# Patient Record
Sex: Male | Born: 1940
Health system: Southern US, Community
[De-identification: ages and names within clinical notes are randomized; demographics above are authoritative.]

## PROBLEM LIST (undated history)

## (undated) DIAGNOSIS — I639 Cerebral infarction, unspecified: Secondary | ICD-10-CM

## (undated) DIAGNOSIS — E7841 Elevated Lipoprotein(a): Secondary | ICD-10-CM

## (undated) DIAGNOSIS — D6861 Antiphospholipid syndrome: Secondary | ICD-10-CM

## (undated) DIAGNOSIS — M40209 Unspecified kyphosis, site unspecified: Secondary | ICD-10-CM

## (undated) DIAGNOSIS — B019 Varicella without complication: Secondary | ICD-10-CM

## (undated) DIAGNOSIS — R29898 Other symptoms and signs involving the musculoskeletal system: Secondary | ICD-10-CM

## (undated) DIAGNOSIS — Z7901 Long term (current) use of anticoagulants: Secondary | ICD-10-CM

## (undated) DIAGNOSIS — G40209 Localization-related (focal) (partial) symptomatic epilepsy and epileptic syndromes with complex partial seizures, not intractable, without status epilepticus: Secondary | ICD-10-CM

## (undated) HISTORY — DX: Long term (current) use of anticoagulants: Z79.01

## (undated) HISTORY — DX: Unspecified kyphosis, site unspecified: M40.209

## (undated) HISTORY — DX: Elevated lipoprotein(a): E78.41

## (undated) HISTORY — DX: Cerebral infarction, unspecified: I63.9

## (undated) HISTORY — DX: Localization-related (focal) (partial) symptomatic epilepsy and epileptic syndromes with complex partial seizures, not intractable, without status epilepticus: G40.209

## (undated) HISTORY — DX: Antiphospholipid syndrome: D68.61

## (undated) HISTORY — DX: Varicella without complication: B01.9

---

## 1998-06-08 DIAGNOSIS — I639 Cerebral infarction, unspecified: Secondary | ICD-10-CM

## 1998-06-08 HISTORY — DX: Cerebral infarction, unspecified: I63.9

## 1998-08-12 ENCOUNTER — Ambulatory Visit (HOSPITAL_COMMUNITY): Admission: RE | Admit: 1998-08-12 | Discharge: 1998-08-12 | Payer: Self-pay | Admitting: Neurology

## 1998-08-12 ENCOUNTER — Encounter: Payer: Self-pay | Admitting: Neurology

## 1998-08-13 ENCOUNTER — Inpatient Hospital Stay (HOSPITAL_COMMUNITY): Admission: RE | Admit: 1998-08-13 | Discharge: 1998-08-17 | Payer: Self-pay | Admitting: Pediatrics

## 1998-10-17 ENCOUNTER — Ambulatory Visit (HOSPITAL_COMMUNITY): Admission: RE | Admit: 1998-10-17 | Discharge: 1998-10-17 | Payer: Self-pay | Admitting: Neurology

## 1998-10-21 ENCOUNTER — Encounter: Payer: Self-pay | Admitting: Neurology

## 1998-10-21 ENCOUNTER — Ambulatory Visit (HOSPITAL_COMMUNITY): Admission: RE | Admit: 1998-10-21 | Discharge: 1998-10-21 | Payer: Self-pay | Admitting: Neurology

## 1998-12-19 ENCOUNTER — Inpatient Hospital Stay (HOSPITAL_COMMUNITY): Admission: EM | Admit: 1998-12-19 | Discharge: 1998-12-26 | Payer: Self-pay | Admitting: Emergency Medicine

## 1998-12-19 ENCOUNTER — Encounter: Payer: Self-pay | Admitting: Neurology

## 1998-12-19 ENCOUNTER — Encounter: Payer: Self-pay | Admitting: Emergency Medicine

## 1998-12-23 ENCOUNTER — Encounter: Payer: Self-pay | Admitting: Neurology

## 1998-12-30 ENCOUNTER — Encounter: Admission: RE | Admit: 1998-12-30 | Discharge: 1999-03-30 | Payer: Self-pay | Admitting: Neurology

## 1999-03-12 ENCOUNTER — Encounter: Admission: RE | Admit: 1999-03-12 | Discharge: 1999-06-10 | Payer: Self-pay | Admitting: Neurology

## 1999-08-12 ENCOUNTER — Encounter: Admission: RE | Admit: 1999-08-12 | Discharge: 1999-09-26 | Payer: Self-pay | Admitting: *Deleted

## 1999-12-27 ENCOUNTER — Encounter: Payer: Self-pay | Admitting: Emergency Medicine

## 1999-12-27 ENCOUNTER — Inpatient Hospital Stay (HOSPITAL_COMMUNITY): Admission: EM | Admit: 1999-12-27 | Discharge: 2000-01-03 | Payer: Self-pay | Admitting: Internal Medicine

## 2004-04-17 ENCOUNTER — Ambulatory Visit: Payer: Self-pay | Admitting: Cardiology

## 2004-04-30 ENCOUNTER — Ambulatory Visit: Payer: Self-pay | Admitting: Cardiology

## 2004-05-28 ENCOUNTER — Ambulatory Visit: Payer: Self-pay | Admitting: *Deleted

## 2004-06-04 ENCOUNTER — Ambulatory Visit: Payer: Self-pay | Admitting: Internal Medicine

## 2004-06-30 ENCOUNTER — Ambulatory Visit: Payer: Self-pay | Admitting: Internal Medicine

## 2004-07-28 ENCOUNTER — Ambulatory Visit: Payer: Self-pay | Admitting: *Deleted

## 2004-08-04 ENCOUNTER — Ambulatory Visit: Payer: Self-pay | Admitting: Internal Medicine

## 2004-08-18 ENCOUNTER — Ambulatory Visit: Payer: Self-pay | Admitting: Cardiology

## 2004-09-15 ENCOUNTER — Ambulatory Visit: Payer: Self-pay | Admitting: Cardiology

## 2004-09-25 ENCOUNTER — Ambulatory Visit: Payer: Self-pay | Admitting: Cardiology

## 2004-10-15 ENCOUNTER — Ambulatory Visit: Payer: Self-pay | Admitting: Cardiology

## 2004-10-29 ENCOUNTER — Ambulatory Visit: Payer: Self-pay | Admitting: *Deleted

## 2004-11-25 ENCOUNTER — Ambulatory Visit: Payer: Self-pay | Admitting: Cardiology

## 2004-12-12 ENCOUNTER — Ambulatory Visit: Payer: Self-pay | Admitting: Cardiology

## 2005-01-09 ENCOUNTER — Ambulatory Visit: Payer: Self-pay | Admitting: Cardiology

## 2005-01-23 ENCOUNTER — Ambulatory Visit: Payer: Self-pay | Admitting: Internal Medicine

## 2005-01-30 ENCOUNTER — Ambulatory Visit: Payer: Self-pay | Admitting: Internal Medicine

## 2005-03-11 ENCOUNTER — Ambulatory Visit: Payer: Self-pay | Admitting: Cardiology

## 2005-03-27 ENCOUNTER — Ambulatory Visit: Payer: Self-pay | Admitting: Cardiovascular Disease

## 2005-04-10 ENCOUNTER — Ambulatory Visit: Payer: Self-pay | Admitting: Cardiology

## 2005-04-24 ENCOUNTER — Ambulatory Visit: Payer: Self-pay | Admitting: Cardiology

## 2005-05-08 ENCOUNTER — Ambulatory Visit: Payer: Self-pay | Admitting: Cardiovascular Disease

## 2005-05-22 ENCOUNTER — Ambulatory Visit: Payer: Self-pay | Admitting: Cardiology

## 2005-06-26 ENCOUNTER — Ambulatory Visit: Payer: Self-pay | Admitting: Cardiology

## 2005-07-03 ENCOUNTER — Ambulatory Visit: Payer: Self-pay | Admitting: Cardiology

## 2005-07-24 ENCOUNTER — Ambulatory Visit: Payer: Self-pay | Admitting: Cardiovascular Disease

## 2005-08-14 ENCOUNTER — Ambulatory Visit: Payer: Self-pay | Admitting: Internal Medicine

## 2005-08-28 ENCOUNTER — Ambulatory Visit: Payer: Self-pay | Admitting: Cardiovascular Disease

## 2005-09-10 ENCOUNTER — Ambulatory Visit: Payer: Self-pay | Admitting: Cardiology

## 2005-10-23 ENCOUNTER — Ambulatory Visit: Payer: Self-pay | Admitting: Cardiology

## 2005-11-13 ENCOUNTER — Ambulatory Visit: Payer: Self-pay | Admitting: Cardiology

## 2005-12-04 ENCOUNTER — Ambulatory Visit: Payer: Self-pay | Admitting: Cardiology

## 2005-12-15 ENCOUNTER — Ambulatory Visit: Payer: Self-pay | Admitting: *Deleted

## 2005-12-25 ENCOUNTER — Ambulatory Visit: Payer: Self-pay | Admitting: Cardiology

## 2005-12-28 ENCOUNTER — Ambulatory Visit: Payer: Self-pay | Admitting: Cardiology

## 2006-01-08 ENCOUNTER — Ambulatory Visit: Payer: Self-pay | Admitting: Internal Medicine

## 2006-01-22 ENCOUNTER — Ambulatory Visit: Payer: Self-pay | Admitting: Internal Medicine

## 2006-02-26 ENCOUNTER — Ambulatory Visit: Payer: Self-pay | Admitting: Cardiovascular Disease

## 2006-03-26 ENCOUNTER — Ambulatory Visit: Payer: Self-pay | Admitting: Cardiology

## 2006-04-02 ENCOUNTER — Ambulatory Visit: Payer: Self-pay | Admitting: Internal Medicine

## 2006-04-16 ENCOUNTER — Ambulatory Visit: Payer: Self-pay | Admitting: Cardiology

## 2006-04-30 ENCOUNTER — Ambulatory Visit: Payer: Self-pay | Admitting: Cardiology

## 2006-05-14 ENCOUNTER — Ambulatory Visit: Payer: Self-pay | Admitting: Cardiology

## 2006-05-27 ENCOUNTER — Ambulatory Visit: Payer: Self-pay | Admitting: Cardiology

## 2006-06-04 ENCOUNTER — Ambulatory Visit: Payer: Self-pay | Admitting: Cardiology

## 2006-06-08 HISTORY — PX: HERNIA REPAIR: SHX51

## 2006-06-11 ENCOUNTER — Ambulatory Visit: Payer: Self-pay | Admitting: Cardiology

## 2006-07-09 ENCOUNTER — Ambulatory Visit: Payer: Self-pay | Admitting: Internal Medicine

## 2006-07-30 ENCOUNTER — Ambulatory Visit: Payer: Self-pay | Admitting: Internal Medicine

## 2006-08-09 ENCOUNTER — Ambulatory Visit: Payer: Self-pay | Admitting: Cardiovascular Disease

## 2006-08-20 ENCOUNTER — Ambulatory Visit: Payer: Self-pay | Admitting: Cardiovascular Disease

## 2006-09-03 ENCOUNTER — Ambulatory Visit: Payer: Self-pay | Admitting: Cardiology

## 2006-09-17 ENCOUNTER — Ambulatory Visit: Payer: Self-pay | Admitting: Cardiovascular Disease

## 2006-10-01 ENCOUNTER — Ambulatory Visit: Payer: Self-pay | Admitting: Cardiology

## 2006-10-20 ENCOUNTER — Ambulatory Visit: Payer: Self-pay | Admitting: Internal Medicine

## 2006-10-29 ENCOUNTER — Ambulatory Visit: Payer: Self-pay | Admitting: Internal Medicine

## 2006-11-12 ENCOUNTER — Ambulatory Visit: Payer: Self-pay | Admitting: Cardiology

## 2006-11-26 ENCOUNTER — Ambulatory Visit: Payer: Self-pay | Admitting: Cardiovascular Disease

## 2006-11-26 LAB — CONVERTED CEMR LAB: INR: 4.4 — ABNORMAL HIGH (ref 0.9–2.0)

## 2006-12-03 ENCOUNTER — Ambulatory Visit: Payer: Self-pay | Admitting: Cardiology

## 2006-12-17 ENCOUNTER — Ambulatory Visit: Payer: Self-pay | Admitting: Cardiology

## 2007-01-07 ENCOUNTER — Ambulatory Visit: Payer: Self-pay | Admitting: Internal Medicine

## 2007-01-21 ENCOUNTER — Ambulatory Visit: Payer: Self-pay | Admitting: Cardiology

## 2007-02-18 ENCOUNTER — Ambulatory Visit: Payer: Self-pay | Admitting: Cardiology

## 2007-03-11 ENCOUNTER — Ambulatory Visit: Payer: Self-pay | Admitting: Internal Medicine

## 2007-04-14 ENCOUNTER — Ambulatory Visit: Payer: Self-pay | Admitting: Cardiology

## 2007-05-24 ENCOUNTER — Ambulatory Visit: Payer: Self-pay | Admitting: Internal Medicine

## 2007-06-06 ENCOUNTER — Ambulatory Visit: Payer: Self-pay | Admitting: Cardiology

## 2007-07-01 ENCOUNTER — Ambulatory Visit: Payer: Self-pay | Admitting: Internal Medicine

## 2007-07-15 ENCOUNTER — Ambulatory Visit: Payer: Self-pay | Admitting: Cardiology

## 2007-07-25 ENCOUNTER — Ambulatory Visit: Payer: Self-pay | Admitting: Cardiovascular Disease

## 2007-08-05 ENCOUNTER — Ambulatory Visit: Payer: Self-pay | Admitting: Internal Medicine

## 2007-09-09 ENCOUNTER — Ambulatory Visit: Payer: Self-pay | Admitting: Cardiology

## 2007-09-09 LAB — CONVERTED CEMR LAB: INR: 5.6 (ref 0.8–1.0)

## 2007-09-23 ENCOUNTER — Ambulatory Visit: Payer: Self-pay | Admitting: Internal Medicine

## 2007-10-07 ENCOUNTER — Ambulatory Visit: Payer: Self-pay | Admitting: Internal Medicine

## 2007-10-13 ENCOUNTER — Inpatient Hospital Stay (HOSPITAL_COMMUNITY): Admission: EM | Admit: 2007-10-13 | Discharge: 2007-10-14 | Payer: Self-pay | Admitting: Emergency Medicine

## 2007-10-17 ENCOUNTER — Ambulatory Visit: Payer: Self-pay | Admitting: Cardiology

## 2007-10-24 ENCOUNTER — Ambulatory Visit: Payer: Self-pay | Admitting: Cardiology

## 2007-11-07 ENCOUNTER — Ambulatory Visit: Payer: Self-pay | Admitting: Cardiovascular Disease

## 2007-11-21 ENCOUNTER — Ambulatory Visit: Payer: Self-pay | Admitting: Internal Medicine

## 2007-12-16 ENCOUNTER — Ambulatory Visit: Payer: Self-pay | Admitting: Cardiovascular Disease

## 2007-12-30 ENCOUNTER — Ambulatory Visit: Payer: Self-pay | Admitting: Cardiology

## 2008-01-20 ENCOUNTER — Ambulatory Visit: Payer: Self-pay | Admitting: Cardiovascular Disease

## 2008-02-10 ENCOUNTER — Ambulatory Visit: Payer: Self-pay | Admitting: Cardiovascular Disease

## 2008-03-16 ENCOUNTER — Ambulatory Visit: Payer: Self-pay | Admitting: Cardiology

## 2008-04-23 ENCOUNTER — Ambulatory Visit: Payer: Self-pay | Admitting: Cardiology

## 2008-05-18 ENCOUNTER — Ambulatory Visit: Payer: Self-pay | Admitting: Cardiovascular Disease

## 2008-06-07 ENCOUNTER — Ambulatory Visit: Payer: Self-pay | Admitting: Internal Medicine

## 2008-06-29 ENCOUNTER — Ambulatory Visit: Payer: Self-pay | Admitting: Internal Medicine

## 2008-07-20 ENCOUNTER — Ambulatory Visit: Payer: Self-pay | Admitting: Internal Medicine

## 2008-08-03 ENCOUNTER — Ambulatory Visit: Payer: Self-pay | Admitting: Internal Medicine

## 2008-08-24 ENCOUNTER — Ambulatory Visit: Payer: Self-pay | Admitting: Cardiology

## 2008-09-21 ENCOUNTER — Ambulatory Visit: Payer: Self-pay | Admitting: Internal Medicine

## 2008-10-02 ENCOUNTER — Ambulatory Visit: Payer: Self-pay | Admitting: Cardiology

## 2008-10-16 ENCOUNTER — Ambulatory Visit: Payer: Self-pay | Admitting: Cardiology

## 2008-11-13 ENCOUNTER — Encounter (INDEPENDENT_AMBULATORY_CARE_PROVIDER_SITE_OTHER): Payer: Self-pay | Admitting: *Deleted

## 2008-11-23 ENCOUNTER — Ambulatory Visit: Payer: Self-pay | Admitting: Internal Medicine

## 2008-11-23 ENCOUNTER — Encounter (INDEPENDENT_AMBULATORY_CARE_PROVIDER_SITE_OTHER): Payer: Self-pay | Admitting: Cardiology

## 2008-11-23 LAB — CONVERTED CEMR LAB: Protime: 19

## 2008-12-12 ENCOUNTER — Encounter: Payer: Self-pay | Admitting: *Deleted

## 2008-12-14 ENCOUNTER — Ambulatory Visit: Payer: Self-pay | Admitting: Cardiology

## 2008-12-14 LAB — CONVERTED CEMR LAB: POC INR: 2.9

## 2009-01-04 ENCOUNTER — Ambulatory Visit: Payer: Self-pay | Admitting: Cardiology

## 2009-01-18 ENCOUNTER — Ambulatory Visit: Payer: Self-pay | Admitting: Internal Medicine

## 2009-02-01 ENCOUNTER — Ambulatory Visit: Payer: Self-pay | Admitting: Cardiovascular Disease

## 2009-02-01 LAB — CONVERTED CEMR LAB: POC INR: 3.2

## 2009-02-22 ENCOUNTER — Ambulatory Visit: Payer: Self-pay | Admitting: Internal Medicine

## 2009-02-22 LAB — CONVERTED CEMR LAB: POC INR: 3.1

## 2009-02-25 ENCOUNTER — Encounter: Payer: Self-pay | Admitting: Cardiology

## 2009-02-25 DIAGNOSIS — G459 Transient cerebral ischemic attack, unspecified: Secondary | ICD-10-CM | POA: Insufficient documentation

## 2009-03-22 ENCOUNTER — Ambulatory Visit: Payer: Self-pay | Admitting: Internal Medicine

## 2009-03-22 LAB — CONVERTED CEMR LAB: POC INR: 2.8

## 2009-04-19 ENCOUNTER — Ambulatory Visit: Payer: Self-pay | Admitting: Cardiology

## 2009-04-19 LAB — CONVERTED CEMR LAB: POC INR: 2.6

## 2009-05-14 ENCOUNTER — Encounter: Payer: Self-pay | Admitting: Cardiovascular Disease

## 2009-05-17 ENCOUNTER — Encounter: Payer: Self-pay | Admitting: Cardiovascular Disease

## 2009-06-06 ENCOUNTER — Ambulatory Visit: Payer: Self-pay | Admitting: Internal Medicine

## 2009-06-21 ENCOUNTER — Ambulatory Visit: Payer: Self-pay | Admitting: Internal Medicine

## 2009-06-21 LAB — CONVERTED CEMR LAB: POC INR: 2.8

## 2009-07-12 ENCOUNTER — Ambulatory Visit: Payer: Self-pay | Admitting: Internal Medicine

## 2009-08-02 ENCOUNTER — Ambulatory Visit: Payer: Self-pay | Admitting: Cardiology

## 2009-08-30 ENCOUNTER — Ambulatory Visit: Payer: Self-pay | Admitting: Internal Medicine

## 2009-09-20 ENCOUNTER — Ambulatory Visit: Payer: Self-pay | Admitting: Internal Medicine

## 2009-09-27 ENCOUNTER — Encounter (INDEPENDENT_AMBULATORY_CARE_PROVIDER_SITE_OTHER): Payer: Self-pay | Admitting: Pharmacist

## 2009-10-18 ENCOUNTER — Ambulatory Visit: Payer: Self-pay | Admitting: Cardiology

## 2009-10-18 LAB — CONVERTED CEMR LAB: POC INR: 3.5

## 2009-11-15 ENCOUNTER — Ambulatory Visit: Payer: Self-pay | Admitting: Cardiovascular Disease

## 2009-11-15 LAB — CONVERTED CEMR LAB: POC INR: 4.5

## 2009-11-22 ENCOUNTER — Ambulatory Visit: Payer: Self-pay | Admitting: Cardiology

## 2009-11-22 LAB — CONVERTED CEMR LAB: POC INR: 3.7

## 2009-11-29 ENCOUNTER — Ambulatory Visit: Payer: Self-pay | Admitting: Internal Medicine

## 2009-11-29 LAB — CONVERTED CEMR LAB: POC INR: 3.2

## 2009-12-20 ENCOUNTER — Ambulatory Visit: Payer: Self-pay | Admitting: Cardiology

## 2009-12-20 LAB — CONVERTED CEMR LAB: POC INR: 2

## 2010-01-03 ENCOUNTER — Ambulatory Visit: Payer: Self-pay | Admitting: Cardiology

## 2010-01-31 ENCOUNTER — Ambulatory Visit: Payer: Self-pay | Admitting: Cardiology

## 2010-02-28 ENCOUNTER — Ambulatory Visit: Payer: Self-pay | Admitting: Internal Medicine

## 2010-03-28 ENCOUNTER — Encounter: Payer: Self-pay | Admitting: Cardiovascular Disease

## 2010-04-11 ENCOUNTER — Ambulatory Visit: Payer: Self-pay | Admitting: Cardiology

## 2010-04-11 LAB — CONVERTED CEMR LAB: POC INR: 3.3

## 2010-05-16 ENCOUNTER — Ambulatory Visit: Payer: Self-pay | Admitting: Internal Medicine

## 2010-06-13 ENCOUNTER — Ambulatory Visit: Admit: 2010-06-13 | Payer: Self-pay

## 2010-07-02 ENCOUNTER — Encounter (INDEPENDENT_AMBULATORY_CARE_PROVIDER_SITE_OTHER): Payer: Self-pay | Admitting: Pharmacist

## 2010-07-07 ENCOUNTER — Ambulatory Visit: Admission: RE | Admit: 2010-07-07 | Discharge: 2010-07-07 | Payer: Self-pay | Source: Home / Self Care

## 2010-07-08 NOTE — Medication Information (Signed)
Summary: rov/cb  Anticoagulant Therapy  Managed by: Bethena Midget, RN, BSN Referring MD: Anabel Bene MD, Maisie Fus PCP: Orlin Hilding MD: Juanda Chance MD, Darielle Hancher Indication 1: Stroke Prevention Indication 2: Transcient Ischemic Attack Lab Used: [facility] [place] Site: [address] INR POC 3.7 INR RANGE 2.5-3.5  Dietary changes: no    Health status changes: no    Bleeding/hemorrhagic complications: no    Recent/future hospitalizations: no    Any changes in medication regimen? no    Recent/future dental: no  Any missed doses?: no       Is patient compliant with meds? yes       Allergies: No Known Drug Allergies  Anticoagulation Management History:      The patient is taking warfarin and comes in today for a routine follow up visit.  Positive risk factors for bleeding include an age of 70 years or older and history of CVA/TIA.  The bleeding index is 'intermediate risk'.  Positive CHADS2 values include Prior Stroke/CVA/TIA.  Negative CHADS2 values include Age > 68 years old.  His last INR was 5.6 RATIO.  Anticoagulation responsible provider: Juanda Chance MD, Smitty Cords.  INR POC: 3.7.  Cuvette Lot#: [id number].  Exp: 01/2011.    Anticoagulation Management Assessment/Plan:      The patient's current anticoagulation dose is Warfarin sodium 5 mg tabs: Use as directed by Anticoagulation Clinic.  The target INR is 2.5 - 3.5.  The next INR is due 11/29/2009.  Anticoagulation instructions were given to patient.  Results were reviewed/authorized by Bethena Midget, RN, BSN.  He was notified by Bethena Midget, RN, BSN.         Prior Anticoagulation Instructions: INR 4.5. Hold Coumadin today, then take 1 tablet daily except 1.5 tablets on Mon, Wed, Fri. Recheck in 1 week.  Current Anticoagulation Instructions: INR 3.7 Today take 2.5mg s then resume 5mg s daily except 7.5mg s on Mondays, Wednesdays and Fridays. Recheck in one week

## 2010-07-08 NOTE — Medication Information (Signed)
Summary: rov/ewj  Anticoagulant Therapy  Managed by: Weston Brass, PharmD Referring MD: Anabel Bene MD, Maisie Fus PCP: Orlin Hilding MD: Riley Kill MD, Maisie Fus Indication 1: Stroke Prevention Indication 2: Transcient Ischemic Attack Lab Used: [facility] [place] Site: [address] INR POC 2.0 INR RANGE 2.5-3.5  Dietary changes: no    Health status changes: no    Bleeding/hemorrhagic complications: no    Recent/future hospitalizations: no    Any changes in medication regimen? no    Recent/future dental: no  Any missed doses?: no       Is patient compliant with meds? yes       Allergies: No Known Drug Allergies  Anticoagulation Management History:      The patient is taking warfarin and comes in today for a routine follow up visit.  Positive risk factors for bleeding include an age of 83 years or older and history of CVA/TIA.  The bleeding index is 'intermediate risk'.  Positive CHADS2 values include Prior Stroke/CVA/TIA.  Negative CHADS2 values include Age > 54 years old.  His last INR was 5.6 RATIO.  Anticoagulation responsible provider: Riley Kill MD, Maisie Fus.  INR POC: 2.0.  Cuvette Lot#: [id number].  Exp: 10/12.    Anticoagulation Management Assessment/Plan:      The patient's current anticoagulation dose is Warfarin sodium 5 mg tabs: Use as directed by Anticoagulation Clinic.  The target INR is 2.5 - 3.5.  The next INR is due 01/03/2010.  Anticoagulation instructions were given to patient.  Results were reviewed/authorized by Weston Brass, PharmD.  He was notified by Weston Brass, PharmD.         Prior Anticoagulation Instructions: INR 3.2  Continue on same dosage 5mg  daily except 7.5mg  on Mondays, Wednesdays, and Fridays. Recheck in 2 weeks.      Current Anticoagulation Instructions: INR 2.0  Take 2 tablets today then resume same dose of 1 tablet every day except 1 1/2 tablets on Monday, Wednesday and Friday.

## 2010-07-08 NOTE — Letter (Signed)
Summary: Anticoagulation Clinic Note   Anticoagulation Clinic Note   Imported By: Roderic Ovens 04/15/2010 13:43:26  _____________________________________________________________________  External Attachment:    Type:   Image     Comment:   External Document

## 2010-07-08 NOTE — Letter (Signed)
Summary: Anticoagulation Clinic Note  Anticoagulation Clinic Note   Imported By: Marylou Mccoy 04/08/2010 15:39:44  _____________________________________________________________________  External Attachment:    Type:   Image     Comment:   External Document

## 2010-07-08 NOTE — Medication Information (Signed)
Summary: rov/tm  Anticoagulant Therapy  Managed by: Cloyde Reams, RN, BSN Referring MD: Anabel Bene MD, Maisie Fus PCP: Orlin Hilding MD: Tenny Craw MD, Gunnar Fusi Indication 1: Stroke Prevention Indication 2: Transcient Ischemic Attack Lab Used: [facility] [place] Site: [address] INR POC 3.2 INR RANGE 2.5-3.5  Dietary changes: no    Health status changes: no    Bleeding/hemorrhagic complications: no    Recent/future hospitalizations: no    Any changes in medication regimen? no    Recent/future dental: no  Any missed doses?: no       Is patient compliant with meds? yes      Comments: Pt out of town unable to return until 12/20/09.  Allergies: No Known Drug Allergies  Anticoagulation Management History:      The patient is taking warfarin and comes in today for a routine follow up visit.  Positive risk factors for bleeding include an age of 40 years or older and history of CVA/TIA.  The bleeding index is 'intermediate risk'.  Positive CHADS2 values include Prior Stroke/CVA/TIA.  Negative CHADS2 values include Age > 70 years old.  His last INR was 5.6 RATIO.  Anticoagulation responsible provider: Tenny Craw MD, Gunnar Fusi.  INR POC: 3.2.  Cuvette Lot#: [id number].  Exp: 01/2011.    Anticoagulation Management Assessment/Plan:      The patient's current anticoagulation dose is Warfarin sodium 5 mg tabs: Use as directed by Anticoagulation Clinic.  The target INR is 2.5 - 3.5.  The next INR is due 12/20/2009.  Anticoagulation instructions were given to patient.  Results were reviewed/authorized by Cloyde Reams, RN, BSN.  He was notified by Cloyde Reams RN.         Prior Anticoagulation Instructions: INR 3.7 Today take 2.5mg s then resume 5mg s daily except 7.5mg s on Mondays, Wednesdays and Fridays. Recheck in one week  Current Anticoagulation Instructions: INR 3.2  Continue on same dosage 5mg  daily except 7.5mg  on Mondays, Wednesdays, and Fridays. Recheck in 2 weeks.

## 2010-07-08 NOTE — Medication Information (Signed)
Summary: rov/sp  Anticoagulant Therapy  Managed by: Cloyde Reams, RN, BSN Referring MD: Anabel Bene MD, Maisie Fus PCP: Orlin Hilding MD: Jens Som MD, Arlys John Indication 1: Stroke Prevention Indication 2: Transcient Ischemic Attack Lab Used: [facility] [place] Site: [address] INR POC 3.5 INR RANGE 2.5-3.5  Dietary changes: no    Health status changes: no    Bleeding/hemorrhagic complications: no    Recent/future hospitalizations: no    Any changes in medication regimen? no    Recent/future dental: no  Any missed doses?: no       Is patient compliant with meds? yes       Allergies: No Known Drug Allergies  Anticoagulation Management History:      The patient is taking warfarin and comes in today for a routine follow up visit.  Positive risk factors for bleeding include an age of 70 years or older and history of CVA/TIA.  The bleeding index is 'intermediate risk'.  Positive CHADS2 values include Prior Stroke/CVA/TIA.  Negative CHADS2 values include Age > 68 years old.  His last INR was 5.6 RATIO.  Anticoagulation responsible provider: Jens Som MD, Arlys John.  INR POC: 3.5.  Cuvette Lot#: [id number].  Exp: 03/2011.    Anticoagulation Management Assessment/Plan:      The patient's current anticoagulation dose is Warfarin sodium 5 mg tabs: Use as directed by Anticoagulation Clinic.  The target INR is 2.5 - 3.5.  The next INR is due 01/31/2010.  Anticoagulation instructions were given to patient.  Results were reviewed/authorized by Cloyde Reams, RN, BSN.  He was notified by Cloyde Reams RN.         Prior Anticoagulation Instructions: INR 2.0  Take 2 tablets today then resume same dose of 1 tablet every day except 1 1/2 tablets on Monday, Wednesday and Friday.    Current Anticoagulation Instructions: INR 3.5  Take 5mg  today, then resume same dosage 5mg  daily except 7.5mg  on Mondays, Wednesdays, and Fridays.  Recheck in 4 weeks.

## 2010-07-08 NOTE — Medication Information (Signed)
Summary: rov/tm  Anticoagulant Therapy  Managed by: Elaina Pattee, PharmD Referring MD: Anabel Bene MD, Maisie Fus PCP: Orlin Hilding MD: Graciela Husbands MD, Viviann Spare Indication 1: Stroke Prevention Lab Used: [facility] [place] Site: [address] INR RANGE 2.5-3.5  Dietary changes: no    Health status changes: no    Bleeding/hemorrhagic complications: no    Recent/future hospitalizations: no    Any changes in medication regimen? no    Recent/future dental: no  Any missed doses?: no       Is patient compliant with meds? yes       Allergies: No Known Drug Allergies  Anticoagulation Management History:      The patient is taking warfarin and comes in today for a routine follow up visit.  Positive risk factors for bleeding include an age of 70 years or older and history of CVA/TIA.  The bleeding index is 'intermediate risk'.  Positive CHADS2 values include Prior Stroke/CVA/TIA.  Negative CHADS2 values include Age > 84 years old.  His last INR was 5.6 RATIO.  Anticoagulation responsible provider: Graciela Husbands MD, Viviann Spare.  Cuvette Lot#: [id number].  Exp: 10/2010.    Anticoagulation Management Assessment/Plan:      The patient's current anticoagulation dose is Warfarin sodium 5 mg tabs: Use as directed by Anticoagulation Clinic.  The target INR is 2.5 - 3.5.  The next INR is due 10/18/2009.  Anticoagulation instructions were given to patient.  Results were reviewed/authorized by Elaina Pattee, PharmD.  He was notified by Elaina Pattee, PharmD.         Prior Anticoagulation Instructions: INR 2.4 Today take 10mg s then resume 5mg s daily except 7.5mg s on Mondays, Wednesdays and Fridays. Recheck in 3 weeks.   Current Anticoagulation Instructions: INR 3.4. Take 1 tablet daily except 1.5 tablets Mon, Wed, Fri.

## 2010-07-08 NOTE — Medication Information (Signed)
Summary: rov/jk  Anticoagulant Therapy  Managed by: Bethena Midget, RN, BSN Referring MD: Anabel Bene MD, Maisie Fus PCP: Orlin Hilding MD: Tenny Craw MD, Gunnar Fusi Indication 1: Stroke Prevention Indication 2: Transcient Ischemic Attack Lab Used: [facility] [place] Site: [address] INR POC 3.3 INR RANGE 2.5-3.5  Dietary changes: no    Health status changes: no    Bleeding/hemorrhagic complications: no    Recent/future hospitalizations: no    Any changes in medication regimen? yes       Details: Pt states he's been on digestive enyzme for approx 6 weeks.  Also taking 1/2 teaspoon daily of ginger with honey  Recent/future dental: no  Any missed doses?: no       Is patient compliant with meds? yes       Allergies: No Known Drug Allergies  Anticoagulation Management History:      The patient is taking warfarin and comes in today for a routine follow up visit.  Positive risk factors for bleeding include an age of 70 years or older and history of CVA/TIA.  The bleeding index is 'intermediate risk'.  Positive CHADS2 values include Prior Stroke/CVA/TIA.  Negative CHADS2 values include Age > 46 years old.  His last INR was 5.6 RATIO.  Anticoagulation responsible provider: Tenny Craw MD, Gunnar Fusi.  INR POC: 3.3.  Cuvette Lot#: [id number].  Exp: 03/2011.    Anticoagulation Management Assessment/Plan:      The patient's current anticoagulation dose is Warfarin sodium 5 mg tabs: Use as directed by Anticoagulation Clinic.  The target INR is 2.5 - 3.5.  The next INR is due 03/28/2010.  Anticoagulation instructions were given to patient.  Results were reviewed/authorized by Bethena Midget, RN, BSN.  He was notified by Bethena Midget, RN, BSN.         Prior Anticoagulation Instructions: INR 2.7  Continue taking 1 tablet (5mg ) every day except take 1.5 tablets (7.5mg ) on Mondays, Wednesdays, and Fridays.  Recheck in 4 weeks.   Current Anticoagulation Instructions: INR 3.3 Continue 5mg  daily  except 7.5mg  on Mondays, Wednesdays and Fridays. Recheck in 4 weeks.

## 2010-07-08 NOTE — Medication Information (Signed)
Summary: rov/cb  Anticoagulant Therapy  Managed by: Weston Brass, PharmD Referring MD: Anabel Bene MD, Maisie Fus PCP: Orlin Hilding MD: Antoine Poche MD, Fayrene Fearing Indication 1: Stroke Prevention Lab Used: [facility] [place] Site: [address] INR POC 3.5 INR RANGE 2.5-3.5  Dietary changes: no    Health status changes: no    Bleeding/hemorrhagic complications: no    Recent/future hospitalizations: no    Any changes in medication regimen? no    Recent/future dental: no  Any missed doses?: no       Is patient compliant with meds? yes       Allergies: No Known Drug Allergies  Anticoagulation Management History:      The patient is taking warfarin and comes in today for a routine follow up visit.  Positive risk factors for bleeding include an age of 44 years or older and history of CVA/TIA.  The bleeding index is 'intermediate risk'.  Positive CHADS2 values include Prior Stroke/CVA/TIA.  Negative CHADS2 values include Age > 40 years old.  His last INR was 5.6 RATIO.  Anticoagulation responsible provider: Antoine Poche MD, Fayrene Fearing.  INR POC: 3.5.  Cuvette Lot#: [id number].  Exp: 01/2011.    Anticoagulation Management Assessment/Plan:      The patient's current anticoagulation dose is Warfarin sodium 5 mg tabs: Use as directed by Anticoagulation Clinic.  The target INR is 2.5 - 3.5.  The next INR is due 11/15/2009.  Anticoagulation instructions were given to patient.  Results were reviewed/authorized by Weston Brass, PharmD.  He was notified by Weston Brass PharmD.         Prior Anticoagulation Instructions: INR 3.4. Take 1 tablet daily except 1.5 tablets Mon, Wed, Fri.  Current Anticoagulation Instructions: INR 3.5  Take 1 tablet today then resume same dose of 1 tablet every day except 1 1/2 tablet on Monday, Wednesday and Friday

## 2010-07-08 NOTE — Medication Information (Signed)
Summary: rov/ez  Anticoagulant Therapy  Managed by: Bethena Midget, RN, BSN Referring MD: Anabel Bene MD, Maisie Fus PCP: Orlin Hilding MD: Johney Frame MD, Fayrene Fearing Indication 1: Stroke Prevention Lab Used: [facility] [place] Site: [address] INR POC 3.4 INR RANGE 2.5-3.5  Dietary changes: no    Health status changes: no    Bleeding/hemorrhagic complications: no    Recent/future hospitalizations: no    Any changes in medication regimen? no    Recent/future dental: no  Any missed doses?: no       Is patient compliant with meds? yes       Allergies: No Known Drug Allergies  Anticoagulation Management History:      The patient is taking warfarin and comes in today for a routine follow up visit.  Positive risk factors for bleeding include an age of 70 years or older and history of CVA/TIA.  The bleeding index is 'intermediate risk'.  Positive CHADS2 values include Prior Stroke/CVA/TIA.  Negative CHADS2 values include Age > 68 years old.  His last INR was 5.6 RATIO.  Anticoagulation responsible provider: Etan Vasudevan MD, Fayrene Fearing.  INR POC: 3.4.  Cuvette Lot#: [id number].  Exp: 09/2010.    Anticoagulation Management Assessment/Plan:      The patient's current anticoagulation dose is Warfarin sodium 5 mg tabs: Use as directed by Anticoagulation Clinic.  The target INR is 2.5 - 3.5.  The next INR is due 08/02/2009.  Anticoagulation instructions were given to patient.  Results were reviewed/authorized by Bethena Midget, RN, BSN.  He was notified by Bethena Midget, RN, BSN.         Prior Anticoagulation Instructions: INR: 2.8 Continue the same dosage of 5mg  daily except 7.5mg  on Mondays, Wednesdays, Fridays Recheck in 3 weeks  Current Anticoagulation Instructions: INR 3.4 Today take 5mg s then resume 5mg s everyday except 7.5mg s on Mondays, Wednesdays and Fridays. Recheck in 3-4 weeks.

## 2010-07-08 NOTE — Medication Information (Signed)
Summary: rov/sp  Anticoagulant Therapy  Managed by: Weston Brass, PharmD Referring MD: Anabel Bene MD, Maisie Fus PCP: Orlin Hilding MD: Johney Frame MD, Fayrene Fearing Indication 1: Stroke Prevention Indication 2: Transcient Ischemic Attack Lab Used: [facility] [place] Site: [address] INR POC 3.1 INR RANGE 2.5-3.5  Dietary changes: no    Health status changes: no    Bleeding/hemorrhagic complications: no    Recent/future hospitalizations: no    Any changes in medication regimen? no    Recent/future dental: no  Any missed doses?: no       Is patient compliant with meds? yes       Allergies: No Known Drug Allergies  Anticoagulation Management History:      The patient is taking warfarin and comes in today for a routine follow up visit.  Positive risk factors for bleeding include an age of 70 years or older and history of CVA/TIA.  The bleeding index is 'intermediate risk'.  Positive CHADS2 values include Prior Stroke/CVA/TIA.  Negative CHADS2 values include Age > 68 years old.  His last INR was 5.6 RATIO.  Anticoagulation responsible provider: Rashauna Tep MD, Fayrene Fearing.  INR POC: 3.1.  Cuvette Lot#: [id number].  Exp: 07/2011.    Anticoagulation Management Assessment/Plan:      The patient's current anticoagulation dose is Warfarin sodium 5 mg tabs: Use as directed by Anticoagulation Clinic.  The target INR is 2.5 - 3.5.  The next INR is due 06/13/2010.  Anticoagulation instructions were given to patient.  Results were reviewed/authorized by Weston Brass, PharmD.  He was notified by Weston Brass PharmD.         Prior Anticoagulation Instructions: INR 3.3  Continue on same dosage 1 tablet daily except 1.5 tablets on Mondays, Wednesdays, and Fridays.  Recheck in 4 weeks.   Current Anticoagulation Instructions: INR 3.1  Continue same dose of 1 tablet every day except 1 1/2 tablets on Monday, Wednesday and Friday.  Recheck INR in 4 weeks.

## 2010-07-08 NOTE — Medication Information (Signed)
Summary: rov/eac  Anticoagulant Therapy  Managed by: Bethena Midget, RN, BSN Referring MD: Anabel Bene MD, Maisie Fus PCP: Orlin Hilding MD: Tenny Craw MD, Gunnar Fusi Indication 1: Stroke Prevention Lab Used: [facility] [place] Site: [address] INR POC 2.4 INR RANGE 2.5-3.5  Dietary changes: no    Health status changes: no    Bleeding/hemorrhagic complications: no    Recent/future hospitalizations: no    Any changes in medication regimen? no    Recent/future dental: no  Any missed doses?: no       Is patient compliant with meds? yes      Comments: 09/27/09 has appt at [facility] who he follows with   Allergies: No Known Drug Allergies  Anticoagulation Management History:      The patient is taking warfarin and comes in today for a routine follow up visit.  Positive risk factors for bleeding include an age of 47 years or older and history of CVA/TIA.  The bleeding index is 'intermediate risk'.  Positive CHADS2 values include Prior Stroke/CVA/TIA.  Negative CHADS2 values include Age > 55 years old.  His last INR was 5.6 RATIO.  Anticoagulation responsible provider: Tenny Craw MD, Gunnar Fusi.  INR POC: 2.4.  Cuvette Lot#: [id number].  Exp: 10/2010.    Anticoagulation Management Assessment/Plan:      The patient's current anticoagulation dose is Warfarin sodium 5 mg tabs: Use as directed by Anticoagulation Clinic.  The target INR is 2.5 - 3.5.  The next INR is due 09/20/2009.  Anticoagulation instructions were given to patient.  Results were reviewed/authorized by Bethena Midget, RN, BSN.  He was notified by Bethena Midget, RN, BSN.         Prior Anticoagulation Instructions: INR 2.6  Continue current dosing schedule.  Take 1.5 tablets on Monday, Wednesday, and Friday and take 1 tablet all other days.  Return to clinic in 4 weeks.    Current Anticoagulation Instructions: INR 2.4 Today take 10mg s then resume 5mg s daily except 7.5mg s on Mondays, Wednesdays and Fridays. Recheck in 3  weeks.

## 2010-07-08 NOTE — Medication Information (Signed)
Summary: rov/tm  Anticoagulant Therapy  Managed by: Weston Brass, PharmD Referring MD: Anabel Bene MD, Maisie Fus PCP: Orlin Hilding MD: Antoine Poche MD, Fayrene Fearing Indication 1: Stroke Prevention Indication 2: Transcient Ischemic Attack Lab Used: [facility] [place] Site: [address] INR RANGE 2.5-3.5  Dietary changes: no    Health status changes: no    Bleeding/hemorrhagic complications: no    Recent/future hospitalizations: no    Any changes in medication regimen? no    Recent/future dental: no  Any missed doses?: no       Is patient compliant with meds? yes       Allergies: No Known Drug Allergies  Anticoagulation Management History:      The patient is taking warfarin and comes in today for a routine follow up visit.  Positive risk factors for bleeding include an age of 70 years or older and history of CVA/TIA.  The bleeding index is 'intermediate risk'.  Positive CHADS2 values include Prior Stroke/CVA/TIA.  Negative CHADS2 values include Age > 68 years old.  His last INR was 5.6 RATIO.  Anticoagulation responsible provider: Antoine Poche MD, Fayrene Fearing.  Cuvette Lot#: [id number].  Exp: 03/2011.    Anticoagulation Management Assessment/Plan:      The patient's current anticoagulation dose is Warfarin sodium 5 mg tabs: Use as directed by Anticoagulation Clinic.  The target INR is 2.5 - 3.5.  The next INR is due 02/28/2010.  Anticoagulation instructions were given to patient.  Results were reviewed/authorized by Weston Brass, PharmD.  He was notified by Gweneth Fritter, PharmD Candidate.         Prior Anticoagulation Instructions: INR 3.5  Take 5mg  today, then resume same dosage 5mg  daily except 7.5mg  on Mondays, Wednesdays, and Fridays.  Recheck in 4 weeks.    Current Anticoagulation Instructions: INR 2.7  Continue taking 1 tablet (5mg ) every day except take 1.5 tablets (7.5mg ) on Mondays, Wednesdays, and Fridays.  Recheck in 4 weeks.

## 2010-07-08 NOTE — Medication Information (Signed)
Summary: ROV/LB  Anticoagulant Therapy  Managed by: Shelby Dubin, PharmD, BCPS, CPP Referring MD: Anabel Bene MD, Maisie Fus PCP: Orlin Hilding MD: Gala Romney MD, Reuel Boom Indication 1: Stroke Prevention Lab Used: [facility] [place] Site: [employer] INR POC 2.8 INR RANGE 2.5-3.5  Dietary changes: no    Health status changes: no    Bleeding/hemorrhagic complications: no    Recent/future hospitalizations: no    Any changes in medication regimen? no    Recent/future dental: no  Any missed doses?: no       Is patient compliant with meds? yes       Allergies: No Known Drug Allergies  Anticoagulation Management History:      The patient is taking warfarin and comes in today for a routine follow up visit.  Positive risk factors for bleeding include an age of 70 years or older and history of CVA/TIA.  The bleeding index is 'intermediate risk'.  Positive CHADS2 values include Prior Stroke/CVA/TIA.  Negative CHADS2 values include Age > 30 years old.  His last INR was 5.6 RATIO.  Anticoagulation responsible provider: Bensimhon MD, Reuel Boom.  INR POC: 2.8.  Cuvette Lot#: [id number].  Exp: 09/2010.    Anticoagulation Management Assessment/Plan:      The patient's current anticoagulation dose is Warfarin sodium 5 mg tabs: Use as directed by Anticoagulation Clinic.  The target INR is 2.5 - 3.5.  The next INR is due 07/12/2009.  Anticoagulation instructions were given to patient.  Results were reviewed/authorized by Shelby Dubin, PharmD, BCPS, CPP.  He was notified by Ysidro Evert, Pharm D Candidate.         Prior Anticoagulation Instructions: INR 2.2  TAKE 1.5 TABLETS (7.5MG ) THEN CONTINUE TAKING 1 TABLET EVERYDAY (5MG ) EXCEPT TAKE 1.5 TABLETS (7.5MG ) ON MONDAYS, WEDNESDAYS, FRIDAYS.      Current Anticoagulation Instructions: INR: 2.8 Continue the same dosage of 5mg  daily except 7.5mg  on Mondays, Wednesdays, Fridays Recheck in 3 weeks

## 2010-07-08 NOTE — Medication Information (Signed)
Summary: rov/tm  Anticoagulant Therapy  Managed by: Eda Keys, PharmD Referring MD: Anabel Bene MD, Maisie Fus PCP: Orlin Hilding MD: Jens Som MD, Arlys John Indication 1: Stroke Prevention Lab Used: [facility] [place] Site: [address] INR POC 2.6 INR RANGE 2.5-3.5           Allergies: No Known Drug Allergies  Anticoagulation Management History:      The patient is taking warfarin and comes in today for a routine follow up visit.  Positive risk factors for bleeding include an age of 68 years or older and history of CVA/TIA.  The bleeding index is 'intermediate risk'.  Positive CHADS2 values include Prior Stroke/CVA/TIA.  Negative CHADS2 values include Age > 72 years old.  His last INR was 5.6 RATIO.  Anticoagulation responsible provider: Jens Som MD, Arlys John.  INR POC: 2.6.  Cuvette Lot#: [id number].  Exp: 10/2010.    Anticoagulation Management Assessment/Plan:      The patient's current anticoagulation dose is Warfarin sodium 5 mg tabs: Use as directed by Anticoagulation Clinic.  The target INR is 2.5 - 3.5.  The next INR is due 08/30/2009.  Anticoagulation instructions were given to patient.  Results were reviewed/authorized by Eda Keys, PharmD.  He was notified by Eda Keys.         Prior Anticoagulation Instructions: INR 3.4 Today take 5mg s then resume 5mg s everyday except 7.5mg s on Mondays, Wednesdays and Fridays. Recheck in 3-4 weeks.   Current Anticoagulation Instructions: INR 2.6  Continue current dosing schedule.  Take 1.5 tablets on Monday, Wednesday, and Friday and take 1 tablet all other days.  Return to clinic in 4 weeks.

## 2010-07-08 NOTE — Medication Information (Signed)
Summary: rov/sp  Anticoagulant Therapy  Managed by: Elaina Pattee, PharmD Referring MD: Anabel Bene MD, Maisie Fus PCP: Orlin Hilding MD: Clifton James MD, Cristal Deer Indication 1: Stroke Prevention Lab Used: [facility] [place] Site: [address] INR POC 4.5 INR RANGE 2.5-3.5  Dietary changes: no    Health status changes: yes    Bleeding/hemorrhagic complications: no    Recent/future hospitalizations: no    Any changes in medication regimen? no    Recent/future dental: no  Any missed doses?: no       Is patient compliant with meds? yes       Allergies: No Known Drug Allergies  Anticoagulation Management History:      The patient is taking warfarin and comes in today for a routine follow up visit.  Positive risk factors for bleeding include an age of 70 years or older and history of CVA/TIA.  The bleeding index is 'intermediate risk'.  Positive CHADS2 values include Prior Stroke/CVA/TIA.  Negative CHADS2 values include Age > 68 years old.  His last INR was 5.6 RATIO.  Anticoagulation responsible provider: Clifton James MD, Cristal Deer.  INR POC: 4.5.  Cuvette Lot#: [id number].  Exp: 01/2011.    Anticoagulation Management Assessment/Plan:      The patient's current anticoagulation dose is Warfarin sodium 5 mg tabs: Use as directed by Anticoagulation Clinic.  The target INR is 2.5 - 3.5.  The next INR is due 11/22/2009.  Anticoagulation instructions were given to patient.  Results were reviewed/authorized by Elaina Pattee, PharmD.  He was notified by Elaina Pattee, PharmD.         Prior Anticoagulation Instructions: INR 3.5  Take 1 tablet today then resume same dose of 1 tablet every day except 1 1/2 tablet on Monday, Wednesday and Friday   Current Anticoagulation Instructions: INR 4.5. Hold Coumadin today, then take 1 tablet daily except 1.5 tablets on Mon, Wed, Fri. Recheck in 1 week.

## 2010-07-08 NOTE — Medication Information (Signed)
Summary: rov/tm  Anticoagulant Therapy  Managed by: Cloyde Reams, RN, BSN Referring MD: Anabel Bene MD, Maisie Fus PCP: Orlin Hilding MD: Juanda Chance MD, Bruce Indication 1: Stroke Prevention Indication 2: Transcient Ischemic Attack Lab Used: [facility] [place] Site: [address] INR POC 3.3 INR RANGE 2.5-3.5  Dietary changes: no    Health status changes: no    Bleeding/hemorrhagic complications: no    Recent/future hospitalizations: no    Any changes in medication regimen? no    Recent/future dental: no  Any missed doses?: no       Is patient compliant with meds? yes       Allergies: No Known Drug Allergies  Anticoagulation Management History:      The patient is taking warfarin and comes in today for a routine follow up visit.  Positive risk factors for bleeding include an age of 70 years or older and history of CVA/TIA.  The bleeding index is 'intermediate risk'.  Positive CHADS2 values include Prior Stroke/CVA/TIA.  Negative CHADS2 values include Age > 32 years old.  His last INR was 5.6 RATIO.  Anticoagulation responsible provider: Juanda Chance MD, Smitty Cords.  INR POC: 3.3.  Cuvette Lot#: [id number].  Exp: 04/2011.    Anticoagulation Management Assessment/Plan:      The patient's current anticoagulation dose is Warfarin sodium 5 mg tabs: Use as directed by Anticoagulation Clinic.  The target INR is 2.5 - 3.5.  The next INR is due 05/09/2010.  Anticoagulation instructions were given to patient.  Results were reviewed/authorized by Cloyde Reams, RN, BSN.  He was notified by Cloyde Reams, RN.         Prior Anticoagulation Instructions: INR 3.3 Continue 5mg  daily except 7.5mg  on Mondays, Wednesdays and Fridays. Recheck in 4 weeks.   Current Anticoagulation Instructions: INR 3.3  Continue on same dosage 1 tablet daily except 1.5 tablets on Mondays, Wednesdays, and Fridays.  Recheck in 4 weeks.

## 2010-07-10 NOTE — Letter (Signed)
Summary: [facility]inquent Coumadin 1  Coumadin  [address]   [place], [place] [address]   Phone: [phone]  Fax: [phone]     July 02, 2010 MRN: [id number]   [facility] [address] [place], [place]  [address]   Dear [name],  This letter is being sent to you as a reminder that it is necessary for you to get your INR/PT checked regularly so that we can optimize your care.  Our records indicate that you were scheduled to have a test done recently.  As of today, we have not received the results of this test.  It is very important that you have your INR checked.  Please call our office at the number listed above to schedule an appointment at your earliest convenience.    If you have recently had your protime checked or have discontinued this medication, please contact our office at the above phone number to clarify this issue.  Thank you for this prompt attention to this important health care matter.  Sincerely,   [facility] Team

## 2010-07-16 NOTE — Medication Information (Signed)
Summary: rov/ewj  Anticoagulant Therapy  Managed by: Bayard Hugger, PharmD Referring MD: Anabel Bene MD, Maisie Fus PCP: Orlin Hilding MD: Antoine Poche MD, Fayrene Fearing Indication 1: Stroke Prevention Indication 2: Transcient Ischemic Attack Lab Used: [facility] [place] Site: [address] INR POC 2.8 INR RANGE 2.5-3.5  Dietary changes: no    Health status changes: no    Bleeding/hemorrhagic complications: no    Recent/future hospitalizations: no    Any changes in medication regimen? no    Recent/future dental: no  Any missed doses?: no       Is patient compliant with meds? yes       Allergies: No Known Drug Allergies  Anticoagulation Management History:      Positive risk factors for bleeding include an age of 70 years or older and history of CVA/TIA.  The bleeding index is 'intermediate risk'.  Positive CHADS2 values include Prior Stroke/CVA/TIA.  Negative CHADS2 values include Age > 68 years old.  His last INR was 5.6 RATIO.  Anticoagulation responsible provider: Antoine Poche MD, Fayrene Fearing.  INR POC: 2.8.  Cuvette Lot#: [id number].  Exp: 06/2011.    Anticoagulation Management Assessment/Plan:      The patient's current anticoagulation dose is Warfarin sodium 5 mg tabs: Use as directed by Anticoagulation Clinic.  The target INR is 2.5 - 3.5.  The next INR is due 08/01/2010.  Anticoagulation instructions were given to patient.  Results were reviewed/authorized by Bayard Hugger, PharmD.         Prior Anticoagulation Instructions: INR 3.1  Continue same dose of 1 tablet every day except 1 1/2 tablets on Monday, Wednesday and Friday.  Recheck INR in 4 weeks.   Current Anticoagulation Instructions: INR 2.8 at goal  Continue current schedule: Take 1 Tablet on Sunday, Tuesday, Thursday and Saturdays, and take 1.5 tablet on the rest of the days.  Recheck INR on Feb. 24th

## 2010-08-11 ENCOUNTER — Telehealth (INDEPENDENT_AMBULATORY_CARE_PROVIDER_SITE_OTHER): Payer: Self-pay | Admitting: *Deleted

## 2010-08-14 DIAGNOSIS — G459 Transient cerebral ischemic attack, unspecified: Secondary | ICD-10-CM

## 2010-08-19 NOTE — Progress Notes (Signed)
Summary: Follow up CVRR appt  Phone Note Outgoing Call   Call placed by: Bethena Midget, RN, BSN,  August 11, 2010 2:14 PM Details for Reason: Schedule CVRR appt Summary of Call: Received the copy of emails pt brought in from his MD at [facility] with his INR result of 2.4 on 08/01/10 and booster dose and instructions to recheck in 3.-4 weeks. Thus, [facility] for pt to call and s/c appt for next week.  Initial call taken by: Bethena Midget, RN, BSN,  August 11, 2010 2:16 PM

## 2010-08-22 ENCOUNTER — Encounter (INDEPENDENT_AMBULATORY_CARE_PROVIDER_SITE_OTHER): Payer: Medicare Other

## 2010-08-22 ENCOUNTER — Encounter: Payer: Self-pay | Admitting: Cardiology

## 2010-08-22 DIAGNOSIS — G459 Transient cerebral ischemic attack, unspecified: Secondary | ICD-10-CM

## 2010-08-22 DIAGNOSIS — Z7901 Long term (current) use of anticoagulants: Secondary | ICD-10-CM

## 2010-08-26 NOTE — Medication Information (Signed)
Summary: rov/tm  Anticoagulant Therapy  Managed by: Samantha Crimes, PharmD Referring MD: Anabel Bene MD, Maisie Fus PCP: Orlin Hilding MD: Daleen Squibb MD, Maisie Fus Indication 1: Stroke Prevention Indication 2: Transcient Ischemic Attack Lab Used: [facility] [place] Site: [address] INR POC 2.8 INR RANGE 2.5-3.5  Dietary changes: no    Health status changes: no    Bleeding/hemorrhagic complications: no    Recent/future hospitalizations: no    Any changes in medication regimen? no    Recent/future dental: no  Any missed doses?: no       Is patient compliant with meds? yes       Current Medications (verified): 1)  Keppra 500 Mg Tabs (Levetiracetam) .Marland Kitchen.. 1 By Mouth Bid 2)  Warfarin Sodium 5 Mg Tabs (Warfarin Sodium) .... Use As Directed By Anticoagulation Clinic 3)  Niaspan 500 Mg Cr-Tabs (Niacin (Antihyperlipidemic)) .Marland Kitchen.. 1 By Mouth At Bedtime 4)  Aspirin 81 Mg Tbec (Aspirin) .Marland Kitchen.. 1 Tab By Mouth Daily 0.5 Hours Prior To Niaspan 5)  Multivitamins  Tabs (Multiple Vitamin) .Marland Kitchen.. 1 Daily 6)  Magnesium 200 Mg Tabs (Magnesium) .Marland Kitchen.. 1 Tab Daily 7)  Calcium 600 1500 Mg Tabs (Calcium Carbonate) .Marland Kitchen.. 1 Daily 8)  Cod Liver Oil  Caps (Cod Liver Oil) .Marland Kitchen.. 1 Daily 9)  Delta D3 400 Unit Tabs (Cholecalciferol) .Marland Kitchen.. 1 Daily  Allergies (verified): No Known Drug Allergies  Anticoagulation Management History:      Positive risk factors for bleeding include an age of 70 years or older and history of CVA/TIA.  The bleeding index is 'intermediate risk'.  Positive CHADS2 values include Prior Stroke/CVA/TIA.  Negative CHADS2 values include Age > 18 years old.  His last INR was 5.6 RATIO.  Anticoagulation responsible provider: Daleen Squibb MD, Maisie Fus.  INR POC: 2.8.  Exp: 06/2011.    Anticoagulation Management Assessment/Plan:      The patient's current anticoagulation dose is Warfarin sodium 5 mg tabs: Use as directed by Anticoagulation Clinic.  The target INR is 2.5 - 3.5.  The next INR is  due 08/01/2010.  Anticoagulation instructions were given to patient.  Results were reviewed/authorized by Samantha Crimes, PharmD.         Prior Anticoagulation Instructions: INR 2.8 at goal  Continue current schedule: Take 1 Tablet on Sunday, Tuesday, Thursday and Saturdays, and take 1.5 tablet on the rest of the days.  Recheck INR on Feb. 24th  Current Anticoagulation Instructions: Return to clinic in 3 weeks on 4/6 @ 2 pm cont with current regimen

## 2010-09-12 ENCOUNTER — Encounter: Payer: Medicare Other | Admitting: *Deleted

## 2010-09-19 ENCOUNTER — Ambulatory Visit (INDEPENDENT_AMBULATORY_CARE_PROVIDER_SITE_OTHER): Payer: Medicare Other | Admitting: *Deleted

## 2010-09-19 DIAGNOSIS — G459 Transient cerebral ischemic attack, unspecified: Secondary | ICD-10-CM

## 2010-09-19 LAB — POCT INR: INR: 3.2

## 2010-09-19 MED ORDER — WARFARIN SODIUM 5 MG PO TABS: 5.0000 mg | ORAL_TABLET | ORAL | Status: AC

## 2010-09-19 MED ORDER — WARFARIN SODIUM 5 MG PO TABS: 5.0000 mg | ORAL_TABLET | ORAL | Status: DC

## 2010-10-17 ENCOUNTER — Ambulatory Visit (INDEPENDENT_AMBULATORY_CARE_PROVIDER_SITE_OTHER): Payer: Medicare Other | Admitting: *Deleted

## 2010-10-17 DIAGNOSIS — G459 Transient cerebral ischemic attack, unspecified: Secondary | ICD-10-CM

## 2010-10-17 LAB — POCT INR: INR: 3.5

## 2010-10-21 NOTE — H&P (Signed)
[name], [name] NO.:  [id number]   MEDICAL RECORD NO.:  [id number]          PATIENT TYPE:  EMS   LOCATION:  MAJO                         FACILITY:  [facility]   PHYSICIAN:  Michiel Cowboy, MDDATE OF BIRTH:  05/17/1940   DATE OF ADMISSION:  10/13/2007  DATE OF DISCHARGE:                              HISTORY & PHYSICAL   PRIMARY CARE Jamie Belger:  Vikki Ports, M.D., [facility].   CHIEF COMPLAINT:  The patient comes in because of complaints of chills  in his chest.   HISTORY OF PRESENT ILLNESS:  This is a 70 year old with history of  antiphospholipid syndrome and history of pontine strokes suffered in  2000, for which he is on Coumadin.  He came to his regular doctor  because he had been having episodes of chest pain, but rather sensation  of chills almost rigor-like in right side of her chest.  It has been  going on for 4 weeks, and he is not quite sure of the etiology.  He also  recently had been feeling somewhat lightheaded and he states that he  tried to lay down and elevate his legs and that did not necessarily  help.  Dr. Theresia Lo felt that there was some evidence of difficulty  walking, but the patient does not endorse this.  The patient has chronic  stutter stridor and difficulty with speech because of history of stroke.  He has also been seen in the past by Neurology for possible migraine  aura without headache and also has history of seizure disorder for which  he takes Keppra.  He reports no evidence of seizure activity.  For the  past few months, he has been on Keppra and seizure activity what he  endorses was more like having difficulty speaking and having almost like  a fat block process, which per Neurology was possibly seizure-like  activity.   REVIEW OF SYSTEMS:  The patient denied chest pain or shortness of  breath.  Denies fevers, but does report chills, which are very localized  to his chest.  Otherwise,  review of systems is negative except for as  per HPI.   PAST MEDICAL HISTORY:  Significant for pontine stroke in 2000, history  of hyperlipidemia, history of hypertension, history of questionable  seizure activity, history of antiphospholipid syndrome followed by [facility],  and history of peptic ulcer disease.   ALLERGIES:  The patient is not to take high-dose ASPIRIN.   SOCIAL HISTORY:  The patient does not smoke, drink, or use drugs.  He  lives alone, divorced, and disabled.   FAMILY HISTORY:  Noncontributory.   MEDICATIONS:  1. Aspirin 81 mg daily.  2. Coumadin 7.5 mg on Tuesdays and Saturdays and 5 mg on all the other      days.  3. Fluticasone spray twice a day.  4. Keppra 500 mg p.o. b.i.d.  5. Allegra 180 mg p.o. daily.  6. Niaspan 500 mg p.o. q.h.s.   PHYSICAL EXAMINATION:  VITALS:  Blood pressure 124/91, respirations 20,  heart rate trending between 45-60, temperature 96.3, and oxygen  saturation 100%  on room air.  GENERAL:  The patient is a thin male in no acute distress, lying down on  a stretcher.  Alert and oriented x3.  Speaks with some stuttering,  actively having word-finding difficulties.  HEAD:  Nontraumatic.  Somewhat dry mucous membranes.  LUNGS:  Clear to auscultation bilaterally.  HEART:  Regular rate and rhythm, but slow.  No murmurs noted.  ABDOMEN:  Soft, nontender, and nondistended.  EXTREMITIES:  Without edema.  NEUROLOGICAL:  Intact.  Strength 5/5 in all extremities.  Finger-to-nose  intact.   LABORATORY DATA:  White blood cell count 2.1 with neutrophil of 0.6,  hemoglobin 13.2, and platelets 153.  Potassium 4.0, sodium 137, bicarb  28, creatinine 0.95.  LFTs within normal limits.  Cardiac enzymes within  normal limits.  UA negative.  D-dimer less than 0.22.  CT of the head  showing old parietal stroke.  EKG showing first-degree block and his  heart rate to 45 and Q waves in lead V2,V1, as well as T-wave inversion  lead III, aVF, with early  repolarization pattern in lead V3 and V4.   Chest x-ray showing no cardiopulmonary disease.   ASSESSMENT/PLAN:  This is a 70 year old gentleman with presyncopal-like  symptoms, bradycardia, and chills in the chest.  1. Bradycardia.  Etiology unclear.  The patient has first-degree      atrioventricular block.  Not sure if it is new or old.  Of note,      per the records, the patient has had cardiac catheterization done      at [facility] in 2001, which was clear.  First set of cardiac      enzymes today is negative.  No chest pain.  The symptoms that he is      describing are very atypical.  Given the risk factors, we will rule      out and watch in Telemetry.  If the patient has pauses or becomes      bradycardic again and symptomatic, he would needed Cardiology      elevation in the morning.  Hold off on any beta-blockers for now.      We will obtain fasting lipid panel in the morning, check TSH.  We      will check hemoglobin A1c level.  2. History of cerebrovascular accident and antiphospholipid syndrome.      Continue Coumadin.  We will hold today's dose because INR is 3.8.      Given the history of unsteady gait, we will obtain MRI and MRA of      the brain to make sure that the patient has not had an acute event.  3. Chest chills.  Unsure of etiology.  Chest x-ray negative.  No white      blood cell elevation rather there is a borderline leukopenia.  We      will obtain blood cultures and monitor overnight for any evidence      of fever.  We will hold off on antibiotics for now.  4. History of allergies.  Continue Allegra.  5. Hyperlipidemia.  Check fasting lipid panel and continue Niaspan.  6. History of seizure disorder.  Unsure if current symptoms could be      consistent with partial seizure.  The patient's seizure history is      very vague with known stereotypical seizures, but rather      abnormalities in his speech.  It is not clear if current symptoms      have anything  to do with seizure activity.  We will continue Keppra      and obtain EEG.  7. History of unstable gait.  We will have PT/OT assessment and Speech      Pathology to evaluate speech and function.  8. Prophylaxis.  The patient is on Coumadin, will do Protonix.   CODE STATUS:  The patient wished to be DNR/DNI.  No family is available  at bedside.     Michiel Cowboy, MD  Electronically Signed    AVD/MEDQ  D:  10/13/2007  T:  10/14/2007  Job:  [id number]

## 2010-10-21 NOTE — Discharge Summary (Signed)
[name], [name]              ACCOUNT NO.:  [id number]   MEDICAL RECORD NO.:  [id number]          PATIENT TYPE:  OBV   LOCATION:  3735                         FACILITY:  [facility]   PHYSICIAN:  Michiel Cowboy, MDDATE OF BIRTH:  05/17/1940   DATE OF ADMISSION:  10/13/2007  DATE OF DISCHARGE:  10/14/2007                               DISCHARGE SUMMARY   DISCHARGE DIAGNOSES:  1. Bradycardia.  2. History of cerebrovascular accident.  3. History of antiphospholipid antibody.  4. History of hypertension and hyperlipidemia.   STUDIES:  Chest x-ray showing no acute disease.  CT of the head showing  old remote infarction of the left parietal lobe.   HOSPITAL COURSE:  Please see full H&P, but briefly, this is a 70-year-  old gentleman with a history of CVA in the past who presented to his  primary care [name] with a history of chills which were mainly in his  chest and also at that time reportedly was endorsing some unsteady gait.  The patient was asked to present to the emergency department, where he  was found to have a heart rate down to 45, during which he was  completely asymptomatic.  The patient was admitted for observation.  On  discussion with the patient, a couple of days prior to this he had an  episode where he felt overall generalized weak and felt like he was  about to pass out.  He laid down, elevated his legs, and this made him  feel better.  Otherwise, he has not had any symptoms of presyncope or  syncope or lightheadedness.  This was an isolated episode.  The patient  completely denies that he had any history of ataxia or unsteadiness.  Denies any history of chest pain or shortness of breath.  Very angry at  why he is in the hospital.  Initially his bradycardia we worked up with  an EKG, which showed first degree AV block.  Thereafter, he had serial  cardiac enzymes, making sure that he had not had an acute coronary  syndrome with a presentation of first-degree AV  block.  Serial cardiac  enzymes were negative.  The patient had a D-dimer, which was negative, a  TSH, which was within normal limits.  The only abnormal lab that was  noted that his white blood cell count overall was slightly low at 2.9  with neutrophil absolute count of 0.8.  The etiology of this is not  quite clear.  The patient was afebrile throughout the hospital stay.  Ordered blood cultures, which are not back at this point.  His UA did  not show any evidence of infection.  The patient basically only wishes  to be discharged to home and does not wish to stay in the hospital any  longer. He is saying that his symptoms have now resolved and he wishes  to follow up with his neurologist at [facility].   Bradycardia:  The patient was placed on telemetry.  Cycled cardiac  enzymes.  On telemetry, there were no pauses, but his heart rate did go  down to the mid 40s  at night.  The patient remained asymptomatic with  this.  The patient refused any further workup or followup of this.  An  echocardiogram was ordered.  We discussed with the patient the possible  need for an outpatient monitoring device to make sure that he did not  have any pauses as an outpatient.  The patient refused to wear this.  Would recommend further followup with the primary care Lucita Montoya and  instructed the patient that if he does feel lightheaded he needs to be  seen by the emergency department or physician.  He would probably  benefit from further workup of his bradycardia but at this point is  unwilling to undergo any.   History of antiphospholipid antibody:  The patient is on Coumadin for  this chronically.  His INR was noted to be elevated on presentation to  3.8.  His Coumadin was held, but the next day his INR was still 3.9.  I  recommended to continue to hold his Coumadin for the next 2 doses and  only restart maybe on Sunday, on Oct 16, 2007, with the regular dose of  5 mg and then have a close followup with the  Coumadin clinic, Dr. Shelby Dubin, who has been doing it for him.  I left a message with Dr. Shelby Dubin and also discussed with the pharmacist here.  Asked the patient  to be seen on Monday and have his INR checked.  He needs to stay between  the 2.5 to 3.5 range.   Unsteady gait:  The patient completely denies this now, that he has any  unsteadiness.  He had a PT/OT evaluation, which he refused, saying he  wished to go home.  He had an MRI ordered, which he refused.  Also given  history of seizures and the strange presentation in the past, including  difficulty with speech, he had an EEG ordered, which he refused.  At  this point, we will discharge the patient and have him follow up with  his primary neurologist at [facility] for further evaluation of any unsteady  gait if he has any.   DISCHARGE MEDICATIONS:  The patient is to continue his home medications,  which were reviewed and include Keppra 500 mg p.o. twice daily, niacin  500 mg p.o. daily, aspirin 81 mg p.o. daily, fexofenadine 180 mg p.o.  daily, Flonase spray daily.  The patient is to restart Coumadin on May  10 at 5 mg per day and have his INR checked on Monday, May 11, with Dr.  Shelby Dubin.   The patient has been warned about the risks of leaving the hospital  without completion of his full workup, or even death from dysrhythmia,  which he states he understands, and still wishes to leave the hospital.      Michiel Cowboy, MD  Electronically Signed     AVD/MEDQ  D:  10/14/2007  T:  10/14/2007  Job:  [id number]   cc:   Vikki Ports, M.D.  Shelby Dubin, PharmD, BCPS, CPP

## 2010-10-24 NOTE — Discharge Summary (Signed)
[place]. [facility]  Patient:    CHARLS, CUSTER                       MRN: [id number] Adm. Date:  12/28/99 Disc. Date: 01/03/00 Attending:  Veneda Melter, M.D. [facility] Dictator:   Tereso Newcomer, P.A.C. CC:         Vikki Ports, M.D.             Anna Genre Little, M.D.             Veneda Melter, M.D. [facility]                           Discharge Summary  DATE OF BIRTH:  May 17, 1940.  REASON FOR ADMISSION:  Palpitations, frequent PVCs.  DISCHARGE DIAGNOSES: 1. Peripheral vascular disease. 2. History of left parietal occipital embolic stroke March of 2000. 3. Repeat cerebrovascular accident with aphasia July of 2000, thought to be    secondary to middle cerebral artery stenosis. 4. History of peptic ulcer disease.  PROCEDURES:  Cardiac catheterization on January 02, 2000 with normal left main, normal LAD, normal circumflex, and 20% mid-stenosis of the RCA.  EF 60% performed by Dr. Lewayne Bunting.  HISTORY OF PRESENT ILLNESS:  This patient was initially seen by our service in consultation on December 29, 1999.  He is a 70 year old male with a history of recurrent left MCA/CVA admitted for palpitations and bigeminy.  The patient was initially seen in March of 2000 after CVA for chest pain.  At that time, his Cardiolite was negative.  CVA workup included a TEE with a positive atrial septal aneurysm, negative coagulase studies.  Initial MRA was negative in March of 2000 for left MCA stenosis, but the patient presented again with new CVA in July of 2000 and angiography revealed LMCA stenosis. PTCA was considered by Dr. Genene Churn. Love but never done.  The patient was placed on Coumadin July of 2000.  July of 2000, service again saw the patient for chest pain.  At that time, cardiac catheterization was suggested but never carried through.  The patient was admitted for complaints of palpitations but no sustained arrhythmia, no syncope, and no chest pain.  Bigeminy was  reported by EMS.  EKG was normal sinus rhythm, sinus bradycardia.  PHYSICAL EXAMINATION:  GENERAL:  Initial physical exam revealed a well-developed, well-nourished in no acute distress.  VITAL SIGNS:  Blood pressure 112/62, pulse 56, temperature 98.2.  NECK:  Without bruits.  CHEST:  Clear.  HEART:  Regular rate and rhythm without murmurs or rubs.  Normal S1 and S2.  ABDOMEN:  Nontender.  EXTREMITIES:  Without edema.  LABORATORY DATA:  Chest x-ray shows no evidence for acute abnormality of the chest.  Initial labs show white count 3800, hemoglobin 13, hematocrit 39.5, platelet count 137,000.  INR 3.3, sodium 141, potassium 4.9, chloride 105, glucose 95, BUN 24, creatinine 1.1, calcium 8.5, magnesium 2.1.  TSH 2.122.  Cardiac enzymes show total CK #1 was 270, #2 was 193, #3 195.  CK-MB #1 was 2.6, #2 was 1.7, #3 was 1.8.  Troponin I was 0.03, #2 was 0.04, #3 was less than 0.03.  HOSPITAL COURSE:  The patient was accepted over to our service on December 30, 1999.  He had an echocardiogram performed that showed a normal left ventricle in size, mild concentric left ventricular hypertrophy.  Left ventricular ejection fraction was normal.  Aneurysmal  atrial septum without shunt by color doppler.  Given the patients history of palpitations and remote history of chest pain, and inferior attenuation shown on Cardiolite (but no ischemia) it was suggested the patient should go for coronary angiogram to define his coronary anatomy.  The patient agreed to this.  The patient had to be taken off of his Coumadin and was watched over the next several days.  He continued to do well.  His INR slowly drifted downward.  The patient was begun on heparin once his INR became subtherapeutic.  The patient went for cardiac catheterization on January 02, 2000 by Dr. Lewayne Bunting.  The results are noted above.  He had no immediate complications.  On the morning of January 03, 2000, he was found to be in stable  condition.  He was felt stable enough for discharge to home.  It was felt that he needed no other cardiac workup.  Given no structural heart disease and no sustained arrhythmias, no further workup was needed for his PVCs.  It was suggested the patient could be started on low-dose beta blocker in the future if his palpitations became bothersome.  The patients INR prior to discharge was 1.3. It was felt the patient should go home on Lovenox 1 mg/kg subcutaneously q.12h. until his INR became therapeutic again.  This was arranged prior to the patients discharge.  The patient has a friend who is a nurse who will help him administer these injections.  DISCHARGE MEDICATIONS: 1. Lovenox 80 mg subcutaneously q.12h. until INR therapeutic. 2. Coumadin 10 mg on January 03, 2000 at 7 p.m. 3. Coumadin 7.5 mg on January 04, 2000 at 7 p.m. 4. Protonix 40 mg q.d.  ACTIVITY:  No driving, heavy lifting, or exertion for three days.  DIET:  Low fat, low cholesterol, low sodium diet.  WOUND CARE:  The patient should watch his groin for any increased swelling, bleeding or bruising.  Call our office with concerns.  FOLLOW-UP:  The follow-up is with Dr. Caryn Bee L. Little on Monday, January 05, 2000.  The patient needs to call for an appointment so that his INR can be checked on that date. DD:  01/03/00 TD:  01/04/00 Job: [id number]

## 2010-10-24 NOTE — Cardiovascular Report (Signed)
[place]. [facility]  Patient:    [name], [name]                     MRN: [id number] Proc. Date: 01/02/00 Adm. Date:  [id number] Attending:  Veneda Melter CC:         Vikki Ports, M.D., phone [phone]             Veneda Melter, M.D. [facility]                        Cardiac Catheterization  REFERRING PHYSICIAN:  Vikki Ports, M.D.  CARDIOLOGIST:  Veneda Melter, M.D.  PROCEDURES PERFORMED: 1. Selective coronary angiography. 2. Ventriculography.  DIAGNOSIS:  No flow-limiting coronary artery disease.  HISTORY:  [name] is a 70 year old African-American male with a history of prior CVA.  The patient presented with complaints of palpitations as well as substernal chest pain with a prior history of a positive Cardiolite study. The patient was referred for diagnostic cardiac catheterization to assess his coronary anatomy by Dr. Chales Abrahams.  DESCRIPTION OF PROCEDURE:  After informed consent was obtained, the patient was brought to the catheterization laboratory.  The right groin was sterilely prepped and draped.  Lidocaine 1% was used to infiltrate the right groin and a 6 French arterial sheath was placed using a Doppler needle to cannulate the artery.  Subsequently, JL4 and JR4 catheters were advanced and selective coronary angiography was performed through various planes using manual injection of contrast.  After coronary angiography, ventriculography was performed using a 6 French angled pigtail catheter.  After the pigtail catheter was placed in the left ventricle, appropriate left-sided hemodynamics were obtained.  Ventriculography was performed in a single RAO projection.  At the termination of the case, the catheters and sheaths were removed, and manual pressure was applied until adequate hemostasis was achieved.  The patient tolerated the procedure well and was transferred to floor in stable condition.  FINDINGS:  HEMODYNAMICS:  Aortic pressure  117/69 mmHg.  Left ventricular pressure 117/13 mmHg.  SELECTIVE CORONARY ANGIOGRAPHY:  Left main coronary artery was a large caliber vessel with no flow-limiting coronary artery disease.  Left anterior descending artery:  There was no evidence of flow-limiting coronary artery disease.  The left circumflex coronary artery.  Again, no flow-limiting coronary artery disease.  Right coronary artery had a mid 20% diffuse narrowing but no flow-limiting disease.  VENTRICULOGRAPHY:  Ventriculography revealed an ejection fraction of approximately 60% with no focal wall motion abnormalities.  This was performed in the RAO projection.  CONCLUSIONS:  No evidence of flow-limiting coronary artery disease.  RECOMMENDATIONS:  The results have been communicated with Dr. Theresia Lo.  The patient will be transferred to the floor and Coumadin can be reinstituted.  He can be further managed with medical therapy.  There is no indication for intervention at this time. DD:  01/02/00 TD:  01/03/00 Job: [id number]

## 2010-11-14 ENCOUNTER — Ambulatory Visit (INDEPENDENT_AMBULATORY_CARE_PROVIDER_SITE_OTHER): Payer: Medicare Other | Admitting: *Deleted

## 2010-11-14 DIAGNOSIS — Z7901 Long term (current) use of anticoagulants: Secondary | ICD-10-CM | POA: Insufficient documentation

## 2010-11-14 DIAGNOSIS — G459 Transient cerebral ischemic attack, unspecified: Secondary | ICD-10-CM

## 2010-11-14 LAB — POCT INR: INR: 2.4

## 2010-11-28 LAB — PROTIME-INR: INR: 3.3 — AB (ref <=1.1)

## 2010-12-05 ENCOUNTER — Ambulatory Visit (INDEPENDENT_AMBULATORY_CARE_PROVIDER_SITE_OTHER): Payer: Self-pay | Admitting: Cardiology

## 2010-12-05 DIAGNOSIS — R0989 Other specified symptoms and signs involving the circulatory and respiratory systems: Secondary | ICD-10-CM

## 2010-12-09 ENCOUNTER — Encounter: Payer: Self-pay | Admitting: Cardiology

## 2010-12-12 ENCOUNTER — Encounter: Payer: Medicare Other | Admitting: *Deleted

## 2010-12-26 ENCOUNTER — Ambulatory Visit (INDEPENDENT_AMBULATORY_CARE_PROVIDER_SITE_OTHER): Payer: Medicare Other | Admitting: *Deleted

## 2010-12-26 DIAGNOSIS — G459 Transient cerebral ischemic attack, unspecified: Secondary | ICD-10-CM

## 2011-01-16 ENCOUNTER — Ambulatory Visit (INDEPENDENT_AMBULATORY_CARE_PROVIDER_SITE_OTHER): Payer: Medicare Other | Admitting: *Deleted

## 2011-01-16 DIAGNOSIS — G459 Transient cerebral ischemic attack, unspecified: Secondary | ICD-10-CM

## 2011-01-30 ENCOUNTER — Ambulatory Visit (INDEPENDENT_AMBULATORY_CARE_PROVIDER_SITE_OTHER): Payer: Medicare Other | Admitting: *Deleted

## 2011-01-30 DIAGNOSIS — G459 Transient cerebral ischemic attack, unspecified: Secondary | ICD-10-CM

## 2011-02-13 ENCOUNTER — Ambulatory Visit (INDEPENDENT_AMBULATORY_CARE_PROVIDER_SITE_OTHER): Payer: Medicare Other | Admitting: *Deleted

## 2011-02-13 DIAGNOSIS — G459 Transient cerebral ischemic attack, unspecified: Secondary | ICD-10-CM

## 2011-03-13 ENCOUNTER — Ambulatory Visit (INDEPENDENT_AMBULATORY_CARE_PROVIDER_SITE_OTHER): Payer: Medicare Other | Admitting: *Deleted

## 2011-03-13 DIAGNOSIS — G459 Transient cerebral ischemic attack, unspecified: Secondary | ICD-10-CM

## 2011-04-10 ENCOUNTER — Ambulatory Visit (INDEPENDENT_AMBULATORY_CARE_PROVIDER_SITE_OTHER): Payer: Medicare Other | Admitting: *Deleted

## 2011-04-10 DIAGNOSIS — Z7901 Long term (current) use of anticoagulants: Secondary | ICD-10-CM

## 2011-04-10 DIAGNOSIS — G459 Transient cerebral ischemic attack, unspecified: Secondary | ICD-10-CM

## 2011-04-10 LAB — POCT INR: INR: 3.1

## 2011-05-08 ENCOUNTER — Ambulatory Visit (INDEPENDENT_AMBULATORY_CARE_PROVIDER_SITE_OTHER): Payer: Medicare Other | Admitting: *Deleted

## 2011-05-08 DIAGNOSIS — Z7901 Long term (current) use of anticoagulants: Secondary | ICD-10-CM

## 2011-05-08 DIAGNOSIS — G459 Transient cerebral ischemic attack, unspecified: Secondary | ICD-10-CM

## 2011-05-08 LAB — POCT INR: INR: 2.4

## 2011-06-05 ENCOUNTER — Ambulatory Visit (INDEPENDENT_AMBULATORY_CARE_PROVIDER_SITE_OTHER): Payer: Medicare Other | Admitting: *Deleted

## 2011-06-05 DIAGNOSIS — Z7901 Long term (current) use of anticoagulants: Secondary | ICD-10-CM

## 2011-06-05 DIAGNOSIS — G459 Transient cerebral ischemic attack, unspecified: Secondary | ICD-10-CM

## 2011-06-05 LAB — POCT INR: INR: 3.8

## 2011-07-03 ENCOUNTER — Encounter: Payer: Medicare Other | Admitting: *Deleted

## 2011-07-10 ENCOUNTER — Ambulatory Visit (INDEPENDENT_AMBULATORY_CARE_PROVIDER_SITE_OTHER): Payer: Medicare Other

## 2011-07-10 DIAGNOSIS — Z7901 Long term (current) use of anticoagulants: Secondary | ICD-10-CM | POA: Diagnosis not present

## 2011-07-10 DIAGNOSIS — G459 Transient cerebral ischemic attack, unspecified: Secondary | ICD-10-CM | POA: Diagnosis not present

## 2011-07-10 LAB — POCT INR: INR: 3.8

## 2011-07-28 DIAGNOSIS — G40209 Localization-related (focal) (partial) symptomatic epilepsy and epileptic syndromes with complex partial seizures, not intractable, without status epilepticus: Secondary | ICD-10-CM | POA: Diagnosis not present

## 2011-08-07 ENCOUNTER — Ambulatory Visit (INDEPENDENT_AMBULATORY_CARE_PROVIDER_SITE_OTHER): Payer: Medicare Other

## 2011-08-07 DIAGNOSIS — Z7901 Long term (current) use of anticoagulants: Secondary | ICD-10-CM | POA: Diagnosis not present

## 2011-08-07 DIAGNOSIS — G459 Transient cerebral ischemic attack, unspecified: Secondary | ICD-10-CM

## 2011-08-07 LAB — POCT INR: INR: 2

## 2011-08-21 ENCOUNTER — Ambulatory Visit (INDEPENDENT_AMBULATORY_CARE_PROVIDER_SITE_OTHER): Payer: Medicare Other | Admitting: *Deleted

## 2011-08-21 DIAGNOSIS — Z7901 Long term (current) use of anticoagulants: Secondary | ICD-10-CM | POA: Diagnosis not present

## 2011-08-21 DIAGNOSIS — G459 Transient cerebral ischemic attack, unspecified: Secondary | ICD-10-CM | POA: Diagnosis not present

## 2011-09-11 ENCOUNTER — Ambulatory Visit (INDEPENDENT_AMBULATORY_CARE_PROVIDER_SITE_OTHER): Payer: Medicare Other

## 2011-09-11 DIAGNOSIS — Z7901 Long term (current) use of anticoagulants: Secondary | ICD-10-CM

## 2011-09-11 DIAGNOSIS — G459 Transient cerebral ischemic attack, unspecified: Secondary | ICD-10-CM

## 2011-09-22 ENCOUNTER — Ambulatory Visit (INDEPENDENT_AMBULATORY_CARE_PROVIDER_SITE_OTHER): Payer: Medicare Other | Admitting: *Deleted

## 2011-09-22 DIAGNOSIS — Z7901 Long term (current) use of anticoagulants: Secondary | ICD-10-CM

## 2011-09-22 DIAGNOSIS — G459 Transient cerebral ischemic attack, unspecified: Secondary | ICD-10-CM

## 2011-10-02 ENCOUNTER — Encounter: Payer: Self-pay | Admitting: Pharmacist

## 2011-10-02 DIAGNOSIS — R894 Abnormal immunological findings in specimens from other organs, systems and tissues: Secondary | ICD-10-CM | POA: Diagnosis not present

## 2011-10-02 DIAGNOSIS — I6789 Other cerebrovascular disease: Secondary | ICD-10-CM | POA: Diagnosis not present

## 2011-10-02 DIAGNOSIS — Z7901 Long term (current) use of anticoagulants: Secondary | ICD-10-CM | POA: Diagnosis not present

## 2011-10-06 DIAGNOSIS — H902 Conductive hearing loss, unspecified: Secondary | ICD-10-CM | POA: Diagnosis not present

## 2011-10-06 DIAGNOSIS — H6120 Impacted cerumen, unspecified ear: Secondary | ICD-10-CM | POA: Diagnosis not present

## 2011-10-23 ENCOUNTER — Ambulatory Visit (INDEPENDENT_AMBULATORY_CARE_PROVIDER_SITE_OTHER): Payer: Medicare Other | Admitting: Pharmacist

## 2011-10-23 DIAGNOSIS — Z7901 Long term (current) use of anticoagulants: Secondary | ICD-10-CM

## 2011-10-23 DIAGNOSIS — G459 Transient cerebral ischemic attack, unspecified: Secondary | ICD-10-CM

## 2011-11-17 DIAGNOSIS — H902 Conductive hearing loss, unspecified: Secondary | ICD-10-CM | POA: Diagnosis not present

## 2011-11-20 ENCOUNTER — Ambulatory Visit (INDEPENDENT_AMBULATORY_CARE_PROVIDER_SITE_OTHER): Payer: Medicare Other | Admitting: *Deleted

## 2011-11-20 DIAGNOSIS — G459 Transient cerebral ischemic attack, unspecified: Secondary | ICD-10-CM | POA: Diagnosis not present

## 2011-11-20 DIAGNOSIS — Z7901 Long term (current) use of anticoagulants: Secondary | ICD-10-CM | POA: Diagnosis not present

## 2011-11-20 LAB — POCT INR: INR: 3.9

## 2011-12-11 ENCOUNTER — Ambulatory Visit (INDEPENDENT_AMBULATORY_CARE_PROVIDER_SITE_OTHER): Payer: Medicare Other | Admitting: *Deleted

## 2011-12-11 DIAGNOSIS — Z7901 Long term (current) use of anticoagulants: Secondary | ICD-10-CM

## 2011-12-11 DIAGNOSIS — G459 Transient cerebral ischemic attack, unspecified: Secondary | ICD-10-CM | POA: Diagnosis not present

## 2012-01-01 ENCOUNTER — Ambulatory Visit (INDEPENDENT_AMBULATORY_CARE_PROVIDER_SITE_OTHER): Payer: Medicare Other | Admitting: *Deleted

## 2012-01-01 DIAGNOSIS — Z7901 Long term (current) use of anticoagulants: Secondary | ICD-10-CM

## 2012-01-01 DIAGNOSIS — G459 Transient cerebral ischemic attack, unspecified: Secondary | ICD-10-CM

## 2012-01-29 ENCOUNTER — Ambulatory Visit (INDEPENDENT_AMBULATORY_CARE_PROVIDER_SITE_OTHER): Payer: Medicare Other | Admitting: *Deleted

## 2012-01-29 DIAGNOSIS — Z7901 Long term (current) use of anticoagulants: Secondary | ICD-10-CM | POA: Diagnosis not present

## 2012-01-29 DIAGNOSIS — G459 Transient cerebral ischemic attack, unspecified: Secondary | ICD-10-CM | POA: Diagnosis not present

## 2012-02-26 ENCOUNTER — Ambulatory Visit (INDEPENDENT_AMBULATORY_CARE_PROVIDER_SITE_OTHER): Payer: Medicare Other | Admitting: *Deleted

## 2012-02-26 DIAGNOSIS — G459 Transient cerebral ischemic attack, unspecified: Secondary | ICD-10-CM | POA: Diagnosis not present

## 2012-02-26 DIAGNOSIS — Z7901 Long term (current) use of anticoagulants: Secondary | ICD-10-CM | POA: Diagnosis not present

## 2012-02-26 LAB — POCT INR: INR: 3.1

## 2012-03-25 ENCOUNTER — Ambulatory Visit (INDEPENDENT_AMBULATORY_CARE_PROVIDER_SITE_OTHER): Payer: Medicare Other

## 2012-03-25 DIAGNOSIS — Z7901 Long term (current) use of anticoagulants: Secondary | ICD-10-CM | POA: Diagnosis not present

## 2012-03-25 DIAGNOSIS — R0989 Other specified symptoms and signs involving the circulatory and respiratory systems: Secondary | ICD-10-CM

## 2012-03-25 DIAGNOSIS — G459 Transient cerebral ischemic attack, unspecified: Secondary | ICD-10-CM | POA: Diagnosis not present

## 2012-03-25 LAB — POCT INR: INR: 3.8

## 2012-04-22 ENCOUNTER — Ambulatory Visit (INDEPENDENT_AMBULATORY_CARE_PROVIDER_SITE_OTHER): Payer: Medicare Other | Admitting: *Deleted

## 2012-04-22 DIAGNOSIS — Z7901 Long term (current) use of anticoagulants: Secondary | ICD-10-CM | POA: Diagnosis not present

## 2012-04-22 DIAGNOSIS — G459 Transient cerebral ischemic attack, unspecified: Secondary | ICD-10-CM | POA: Diagnosis not present

## 2012-05-06 DIAGNOSIS — Z7901 Long term (current) use of anticoagulants: Secondary | ICD-10-CM | POA: Diagnosis not present

## 2012-05-06 DIAGNOSIS — E7841 Elevated Lipoprotein(a): Secondary | ICD-10-CM

## 2012-05-06 DIAGNOSIS — D6859 Other primary thrombophilia: Secondary | ICD-10-CM | POA: Diagnosis not present

## 2012-05-06 DIAGNOSIS — D6861 Antiphospholipid syndrome: Secondary | ICD-10-CM

## 2012-05-06 HISTORY — DX: Elevated lipoprotein(a): E78.41

## 2012-05-06 HISTORY — DX: Antiphospholipid syndrome: D68.61

## 2012-05-06 HISTORY — DX: Long term (current) use of anticoagulants: Z79.01

## 2012-05-20 ENCOUNTER — Ambulatory Visit (INDEPENDENT_AMBULATORY_CARE_PROVIDER_SITE_OTHER): Payer: Medicare Other | Admitting: *Deleted

## 2012-05-20 DIAGNOSIS — G459 Transient cerebral ischemic attack, unspecified: Secondary | ICD-10-CM

## 2012-05-20 DIAGNOSIS — Z7901 Long term (current) use of anticoagulants: Secondary | ICD-10-CM

## 2012-05-20 LAB — POCT INR: INR: 3.7

## 2012-06-03 ENCOUNTER — Ambulatory Visit (INDEPENDENT_AMBULATORY_CARE_PROVIDER_SITE_OTHER): Payer: Medicare Other | Admitting: *Deleted

## 2012-06-03 DIAGNOSIS — G459 Transient cerebral ischemic attack, unspecified: Secondary | ICD-10-CM

## 2012-06-03 DIAGNOSIS — Z7901 Long term (current) use of anticoagulants: Secondary | ICD-10-CM

## 2012-06-03 LAB — POCT INR: INR: 2.7

## 2012-07-08 ENCOUNTER — Ambulatory Visit (INDEPENDENT_AMBULATORY_CARE_PROVIDER_SITE_OTHER): Payer: Medicare Other

## 2012-07-08 DIAGNOSIS — Z7901 Long term (current) use of anticoagulants: Secondary | ICD-10-CM | POA: Diagnosis not present

## 2012-07-08 DIAGNOSIS — G459 Transient cerebral ischemic attack, unspecified: Secondary | ICD-10-CM

## 2012-07-26 DIAGNOSIS — R569 Unspecified convulsions: Secondary | ICD-10-CM | POA: Diagnosis not present

## 2012-08-05 ENCOUNTER — Ambulatory Visit (INDEPENDENT_AMBULATORY_CARE_PROVIDER_SITE_OTHER): Payer: Medicare Other | Admitting: *Deleted

## 2012-08-05 DIAGNOSIS — G459 Transient cerebral ischemic attack, unspecified: Secondary | ICD-10-CM | POA: Diagnosis not present

## 2012-08-26 DIAGNOSIS — M1990 Unspecified osteoarthritis, unspecified site: Secondary | ICD-10-CM | POA: Insufficient documentation

## 2012-08-26 DIAGNOSIS — Z5181 Encounter for therapeutic drug level monitoring: Secondary | ICD-10-CM | POA: Diagnosis not present

## 2012-08-26 DIAGNOSIS — IMO0001 Reserved for inherently not codable concepts without codable children: Secondary | ICD-10-CM | POA: Diagnosis not present

## 2012-08-26 DIAGNOSIS — D649 Anemia, unspecified: Secondary | ICD-10-CM | POA: Diagnosis not present

## 2012-08-26 DIAGNOSIS — R209 Unspecified disturbances of skin sensation: Secondary | ICD-10-CM | POA: Diagnosis not present

## 2012-08-26 DIAGNOSIS — Z8673 Personal history of transient ischemic attack (TIA), and cerebral infarction without residual deficits: Secondary | ICD-10-CM | POA: Diagnosis not present

## 2012-08-26 DIAGNOSIS — D6859 Other primary thrombophilia: Secondary | ICD-10-CM | POA: Diagnosis not present

## 2012-08-26 DIAGNOSIS — Z7901 Long term (current) use of anticoagulants: Secondary | ICD-10-CM | POA: Diagnosis not present

## 2012-09-16 ENCOUNTER — Ambulatory Visit (INDEPENDENT_AMBULATORY_CARE_PROVIDER_SITE_OTHER): Payer: Medicare Other | Admitting: *Deleted

## 2012-09-16 DIAGNOSIS — Z7901 Long term (current) use of anticoagulants: Secondary | ICD-10-CM

## 2012-09-16 DIAGNOSIS — G459 Transient cerebral ischemic attack, unspecified: Secondary | ICD-10-CM

## 2012-09-16 LAB — POCT INR: INR: 3

## 2012-11-04 ENCOUNTER — Ambulatory Visit (INDEPENDENT_AMBULATORY_CARE_PROVIDER_SITE_OTHER): Payer: Medicare Other | Admitting: Pharmacist

## 2012-11-04 DIAGNOSIS — Z7901 Long term (current) use of anticoagulants: Secondary | ICD-10-CM

## 2012-11-04 DIAGNOSIS — G459 Transient cerebral ischemic attack, unspecified: Secondary | ICD-10-CM | POA: Diagnosis not present

## 2012-11-04 LAB — POCT INR: INR: 2.4

## 2012-12-08 ENCOUNTER — Ambulatory Visit (INDEPENDENT_AMBULATORY_CARE_PROVIDER_SITE_OTHER): Payer: Medicare Other | Admitting: *Deleted

## 2012-12-08 DIAGNOSIS — G459 Transient cerebral ischemic attack, unspecified: Secondary | ICD-10-CM

## 2012-12-08 DIAGNOSIS — Z7901 Long term (current) use of anticoagulants: Secondary | ICD-10-CM

## 2012-12-08 LAB — POCT INR: INR: 2.6

## 2012-12-30 DIAGNOSIS — M2550 Pain in unspecified joint: Secondary | ICD-10-CM | POA: Diagnosis not present

## 2012-12-30 DIAGNOSIS — I749 Embolism and thrombosis of unspecified artery: Secondary | ICD-10-CM | POA: Diagnosis not present

## 2012-12-30 DIAGNOSIS — D6859 Other primary thrombophilia: Secondary | ICD-10-CM | POA: Diagnosis not present

## 2013-01-20 ENCOUNTER — Ambulatory Visit (INDEPENDENT_AMBULATORY_CARE_PROVIDER_SITE_OTHER): Payer: Medicare Other | Admitting: *Deleted

## 2013-01-20 DIAGNOSIS — G459 Transient cerebral ischemic attack, unspecified: Secondary | ICD-10-CM

## 2013-01-20 DIAGNOSIS — Z7901 Long term (current) use of anticoagulants: Secondary | ICD-10-CM

## 2013-01-20 LAB — POCT INR: INR: 2.6

## 2013-03-03 ENCOUNTER — Ambulatory Visit (INDEPENDENT_AMBULATORY_CARE_PROVIDER_SITE_OTHER): Payer: Medicare Other | Admitting: *Deleted

## 2013-03-03 DIAGNOSIS — G459 Transient cerebral ischemic attack, unspecified: Secondary | ICD-10-CM

## 2013-03-03 DIAGNOSIS — Z7901 Long term (current) use of anticoagulants: Secondary | ICD-10-CM

## 2013-03-03 LAB — POCT INR: INR: 2.8

## 2013-04-14 ENCOUNTER — Ambulatory Visit (INDEPENDENT_AMBULATORY_CARE_PROVIDER_SITE_OTHER): Payer: Medicare Other | Admitting: Pharmacist

## 2013-04-14 DIAGNOSIS — G459 Transient cerebral ischemic attack, unspecified: Secondary | ICD-10-CM

## 2013-04-14 DIAGNOSIS — Z7901 Long term (current) use of anticoagulants: Secondary | ICD-10-CM

## 2013-04-14 LAB — POCT INR: INR: 3.9

## 2013-04-28 ENCOUNTER — Ambulatory Visit (INDEPENDENT_AMBULATORY_CARE_PROVIDER_SITE_OTHER): Payer: Medicare Other | Admitting: General Practice

## 2013-04-28 DIAGNOSIS — Z7901 Long term (current) use of anticoagulants: Secondary | ICD-10-CM | POA: Diagnosis not present

## 2013-04-28 DIAGNOSIS — G459 Transient cerebral ischemic attack, unspecified: Secondary | ICD-10-CM | POA: Diagnosis not present

## 2013-05-26 DIAGNOSIS — D6859 Other primary thrombophilia: Secondary | ICD-10-CM | POA: Diagnosis not present

## 2013-05-26 DIAGNOSIS — I6350 Cerebral infarction due to unspecified occlusion or stenosis of unspecified cerebral artery: Secondary | ICD-10-CM | POA: Diagnosis not present

## 2013-05-26 DIAGNOSIS — I749 Embolism and thrombosis of unspecified artery: Secondary | ICD-10-CM | POA: Diagnosis not present

## 2013-05-26 DIAGNOSIS — Z7901 Long term (current) use of anticoagulants: Secondary | ICD-10-CM | POA: Diagnosis not present

## 2013-05-31 DIAGNOSIS — Z8673 Personal history of transient ischemic attack (TIA), and cerebral infarction without residual deficits: Secondary | ICD-10-CM | POA: Diagnosis not present

## 2013-05-31 DIAGNOSIS — G9389 Other specified disorders of brain: Secondary | ICD-10-CM | POA: Diagnosis not present

## 2013-05-31 DIAGNOSIS — R930 Abnormal findings on diagnostic imaging of skull and head, not elsewhere classified: Secondary | ICD-10-CM | POA: Diagnosis not present

## 2013-05-31 DIAGNOSIS — I6350 Cerebral infarction due to unspecified occlusion or stenosis of unspecified cerebral artery: Secondary | ICD-10-CM | POA: Diagnosis not present

## 2013-05-31 DIAGNOSIS — I6789 Other cerebrovascular disease: Secondary | ICD-10-CM | POA: Diagnosis not present

## 2013-06-02 ENCOUNTER — Ambulatory Visit (INDEPENDENT_AMBULATORY_CARE_PROVIDER_SITE_OTHER): Payer: Medicare Other | Admitting: *Deleted

## 2013-06-02 DIAGNOSIS — Z7901 Long term (current) use of anticoagulants: Secondary | ICD-10-CM

## 2013-06-02 DIAGNOSIS — G459 Transient cerebral ischemic attack, unspecified: Secondary | ICD-10-CM | POA: Diagnosis not present

## 2013-06-12 DIAGNOSIS — I639 Cerebral infarction, unspecified: Secondary | ICD-10-CM | POA: Insufficient documentation

## 2013-06-14 DIAGNOSIS — I6990 Unspecified sequelae of unspecified cerebrovascular disease: Secondary | ICD-10-CM | POA: Diagnosis not present

## 2013-06-14 DIAGNOSIS — I6350 Cerebral infarction due to unspecified occlusion or stenosis of unspecified cerebral artery: Secondary | ICD-10-CM | POA: Diagnosis not present

## 2013-06-14 DIAGNOSIS — I6529 Occlusion and stenosis of unspecified carotid artery: Secondary | ICD-10-CM | POA: Diagnosis not present

## 2013-06-14 DIAGNOSIS — I672 Cerebral atherosclerosis: Secondary | ICD-10-CM | POA: Diagnosis not present

## 2013-06-14 DIAGNOSIS — Z7901 Long term (current) use of anticoagulants: Secondary | ICD-10-CM | POA: Diagnosis not present

## 2013-06-19 DIAGNOSIS — R5381 Other malaise: Secondary | ICD-10-CM | POA: Diagnosis not present

## 2013-06-19 DIAGNOSIS — R209 Unspecified disturbances of skin sensation: Secondary | ICD-10-CM | POA: Diagnosis not present

## 2013-06-19 DIAGNOSIS — Z8673 Personal history of transient ischemic attack (TIA), and cerebral infarction without residual deficits: Secondary | ICD-10-CM | POA: Diagnosis not present

## 2013-06-19 DIAGNOSIS — I6350 Cerebral infarction due to unspecified occlusion or stenosis of unspecified cerebral artery: Secondary | ICD-10-CM | POA: Diagnosis not present

## 2013-06-19 DIAGNOSIS — R5383 Other fatigue: Secondary | ICD-10-CM | POA: Diagnosis not present

## 2013-06-23 DIAGNOSIS — B029 Zoster without complications: Secondary | ICD-10-CM | POA: Diagnosis not present

## 2013-06-30 ENCOUNTER — Ambulatory Visit (INDEPENDENT_AMBULATORY_CARE_PROVIDER_SITE_OTHER): Payer: Medicare Other

## 2013-06-30 DIAGNOSIS — G459 Transient cerebral ischemic attack, unspecified: Secondary | ICD-10-CM

## 2013-06-30 DIAGNOSIS — Z7901 Long term (current) use of anticoagulants: Secondary | ICD-10-CM

## 2013-06-30 DIAGNOSIS — Z5181 Encounter for therapeutic drug level monitoring: Secondary | ICD-10-CM | POA: Insufficient documentation

## 2013-06-30 LAB — POCT INR: INR: 5.2

## 2013-07-07 ENCOUNTER — Ambulatory Visit (INDEPENDENT_AMBULATORY_CARE_PROVIDER_SITE_OTHER): Payer: Medicare Other | Admitting: Pharmacist

## 2013-07-07 DIAGNOSIS — Z5181 Encounter for therapeutic drug level monitoring: Secondary | ICD-10-CM

## 2013-07-07 DIAGNOSIS — Z7901 Long term (current) use of anticoagulants: Secondary | ICD-10-CM

## 2013-07-07 DIAGNOSIS — G459 Transient cerebral ischemic attack, unspecified: Secondary | ICD-10-CM

## 2013-07-07 LAB — POCT INR: INR: 4.8

## 2013-07-14 ENCOUNTER — Ambulatory Visit (INDEPENDENT_AMBULATORY_CARE_PROVIDER_SITE_OTHER): Payer: Medicare Other | Admitting: *Deleted

## 2013-07-14 DIAGNOSIS — Z7901 Long term (current) use of anticoagulants: Secondary | ICD-10-CM | POA: Diagnosis not present

## 2013-07-14 DIAGNOSIS — G459 Transient cerebral ischemic attack, unspecified: Secondary | ICD-10-CM | POA: Diagnosis not present

## 2013-07-14 DIAGNOSIS — Z5181 Encounter for therapeutic drug level monitoring: Secondary | ICD-10-CM

## 2013-07-14 LAB — POCT INR: INR: 3.1

## 2013-08-11 ENCOUNTER — Ambulatory Visit (INDEPENDENT_AMBULATORY_CARE_PROVIDER_SITE_OTHER): Payer: Medicare Other | Admitting: *Deleted

## 2013-08-11 DIAGNOSIS — Z5181 Encounter for therapeutic drug level monitoring: Secondary | ICD-10-CM

## 2013-08-11 DIAGNOSIS — G459 Transient cerebral ischemic attack, unspecified: Secondary | ICD-10-CM | POA: Diagnosis not present

## 2013-08-11 LAB — POCT INR: INR: 3.6

## 2013-08-25 ENCOUNTER — Ambulatory Visit (INDEPENDENT_AMBULATORY_CARE_PROVIDER_SITE_OTHER): Payer: Medicare Other | Admitting: Pharmacist

## 2013-08-25 DIAGNOSIS — G459 Transient cerebral ischemic attack, unspecified: Secondary | ICD-10-CM | POA: Diagnosis not present

## 2013-08-25 DIAGNOSIS — Z5181 Encounter for therapeutic drug level monitoring: Secondary | ICD-10-CM | POA: Diagnosis not present

## 2013-08-25 LAB — POCT INR: INR: 1.9

## 2013-09-08 ENCOUNTER — Ambulatory Visit (INDEPENDENT_AMBULATORY_CARE_PROVIDER_SITE_OTHER): Payer: Medicare Other | Admitting: *Deleted

## 2013-09-08 DIAGNOSIS — Z5181 Encounter for therapeutic drug level monitoring: Secondary | ICD-10-CM | POA: Diagnosis not present

## 2013-09-08 DIAGNOSIS — G459 Transient cerebral ischemic attack, unspecified: Secondary | ICD-10-CM

## 2013-09-08 LAB — POCT INR: INR: 3.2

## 2013-09-29 ENCOUNTER — Ambulatory Visit (INDEPENDENT_AMBULATORY_CARE_PROVIDER_SITE_OTHER): Payer: Medicare Other

## 2013-09-29 DIAGNOSIS — G459 Transient cerebral ischemic attack, unspecified: Secondary | ICD-10-CM

## 2013-09-29 DIAGNOSIS — Z5181 Encounter for therapeutic drug level monitoring: Secondary | ICD-10-CM | POA: Diagnosis not present

## 2013-09-29 LAB — POCT INR: INR: 3.2

## 2013-10-17 DIAGNOSIS — Z79899 Other long term (current) drug therapy: Secondary | ICD-10-CM | POA: Diagnosis not present

## 2013-10-17 DIAGNOSIS — G40109 Localization-related (focal) (partial) symptomatic epilepsy and epileptic syndromes with simple partial seizures, not intractable, without status epilepticus: Secondary | ICD-10-CM | POA: Diagnosis not present

## 2013-10-17 DIAGNOSIS — D6859 Other primary thrombophilia: Secondary | ICD-10-CM | POA: Diagnosis not present

## 2013-10-17 DIAGNOSIS — I82409 Acute embolism and thrombosis of unspecified deep veins of unspecified lower extremity: Secondary | ICD-10-CM | POA: Diagnosis not present

## 2013-10-17 DIAGNOSIS — R569 Unspecified convulsions: Secondary | ICD-10-CM | POA: Diagnosis not present

## 2013-10-17 DIAGNOSIS — I6350 Cerebral infarction due to unspecified occlusion or stenosis of unspecified cerebral artery: Secondary | ICD-10-CM | POA: Diagnosis not present

## 2013-10-17 DIAGNOSIS — Z8673 Personal history of transient ischemic attack (TIA), and cerebral infarction without residual deficits: Secondary | ICD-10-CM | POA: Diagnosis not present

## 2013-10-17 DIAGNOSIS — Z7901 Long term (current) use of anticoagulants: Secondary | ICD-10-CM | POA: Diagnosis not present

## 2013-10-27 ENCOUNTER — Ambulatory Visit (INDEPENDENT_AMBULATORY_CARE_PROVIDER_SITE_OTHER): Payer: Medicare Other | Admitting: *Deleted

## 2013-10-27 DIAGNOSIS — G459 Transient cerebral ischemic attack, unspecified: Secondary | ICD-10-CM

## 2013-10-27 DIAGNOSIS — Z5181 Encounter for therapeutic drug level monitoring: Secondary | ICD-10-CM

## 2013-10-27 LAB — POCT INR: INR: 4.2

## 2013-11-10 ENCOUNTER — Ambulatory Visit (INDEPENDENT_AMBULATORY_CARE_PROVIDER_SITE_OTHER): Payer: Medicare Other | Admitting: *Deleted

## 2013-11-10 DIAGNOSIS — Z5181 Encounter for therapeutic drug level monitoring: Secondary | ICD-10-CM | POA: Diagnosis not present

## 2013-11-10 DIAGNOSIS — G459 Transient cerebral ischemic attack, unspecified: Secondary | ICD-10-CM

## 2013-11-10 LAB — POCT INR: INR: 3.2

## 2013-11-17 DIAGNOSIS — I872 Venous insufficiency (chronic) (peripheral): Secondary | ICD-10-CM | POA: Diagnosis not present

## 2013-11-17 DIAGNOSIS — I6350 Cerebral infarction due to unspecified occlusion or stenosis of unspecified cerebral artery: Secondary | ICD-10-CM | POA: Diagnosis not present

## 2013-11-17 DIAGNOSIS — D6859 Other primary thrombophilia: Secondary | ICD-10-CM | POA: Diagnosis not present

## 2013-11-17 DIAGNOSIS — R030 Elevated blood-pressure reading, without diagnosis of hypertension: Secondary | ICD-10-CM | POA: Diagnosis not present

## 2013-11-17 DIAGNOSIS — R609 Edema, unspecified: Secondary | ICD-10-CM | POA: Diagnosis not present

## 2013-11-29 DIAGNOSIS — R609 Edema, unspecified: Secondary | ICD-10-CM | POA: Diagnosis not present

## 2013-11-29 DIAGNOSIS — M25579 Pain in unspecified ankle and joints of unspecified foot: Secondary | ICD-10-CM | POA: Diagnosis not present

## 2013-12-04 DIAGNOSIS — I6350 Cerebral infarction due to unspecified occlusion or stenosis of unspecified cerebral artery: Secondary | ICD-10-CM | POA: Diagnosis not present

## 2013-12-15 ENCOUNTER — Ambulatory Visit (INDEPENDENT_AMBULATORY_CARE_PROVIDER_SITE_OTHER): Payer: Medicare Other

## 2013-12-15 DIAGNOSIS — Z5181 Encounter for therapeutic drug level monitoring: Secondary | ICD-10-CM | POA: Diagnosis not present

## 2013-12-15 DIAGNOSIS — G459 Transient cerebral ischemic attack, unspecified: Secondary | ICD-10-CM | POA: Diagnosis not present

## 2013-12-15 LAB — POCT INR: INR: 3.8

## 2014-01-02 DIAGNOSIS — I872 Venous insufficiency (chronic) (peripheral): Secondary | ICD-10-CM | POA: Diagnosis not present

## 2014-01-02 DIAGNOSIS — I6350 Cerebral infarction due to unspecified occlusion or stenosis of unspecified cerebral artery: Secondary | ICD-10-CM | POA: Diagnosis not present

## 2014-01-02 DIAGNOSIS — R609 Edema, unspecified: Secondary | ICD-10-CM | POA: Diagnosis not present

## 2014-01-02 DIAGNOSIS — R030 Elevated blood-pressure reading, without diagnosis of hypertension: Secondary | ICD-10-CM | POA: Diagnosis not present

## 2014-01-02 DIAGNOSIS — D6859 Other primary thrombophilia: Secondary | ICD-10-CM | POA: Diagnosis not present

## 2014-01-05 ENCOUNTER — Ambulatory Visit (INDEPENDENT_AMBULATORY_CARE_PROVIDER_SITE_OTHER): Payer: Medicare Other | Admitting: Pharmacist

## 2014-01-05 DIAGNOSIS — G459 Transient cerebral ischemic attack, unspecified: Secondary | ICD-10-CM

## 2014-01-05 DIAGNOSIS — Z5181 Encounter for therapeutic drug level monitoring: Secondary | ICD-10-CM | POA: Diagnosis not present

## 2014-01-05 LAB — POCT INR: INR: 3.3

## 2014-01-18 DIAGNOSIS — Z79899 Other long term (current) drug therapy: Secondary | ICD-10-CM | POA: Diagnosis not present

## 2014-01-18 DIAGNOSIS — R569 Unspecified convulsions: Secondary | ICD-10-CM | POA: Diagnosis not present

## 2014-01-18 DIAGNOSIS — I6350 Cerebral infarction due to unspecified occlusion or stenosis of unspecified cerebral artery: Secondary | ICD-10-CM | POA: Diagnosis not present

## 2014-01-18 DIAGNOSIS — I82409 Acute embolism and thrombosis of unspecified deep veins of unspecified lower extremity: Secondary | ICD-10-CM | POA: Diagnosis not present

## 2014-02-02 ENCOUNTER — Ambulatory Visit (INDEPENDENT_AMBULATORY_CARE_PROVIDER_SITE_OTHER): Payer: Medicare Other

## 2014-02-02 DIAGNOSIS — G459 Transient cerebral ischemic attack, unspecified: Secondary | ICD-10-CM | POA: Diagnosis not present

## 2014-02-02 DIAGNOSIS — Z5181 Encounter for therapeutic drug level monitoring: Secondary | ICD-10-CM | POA: Diagnosis not present

## 2014-02-02 LAB — POCT INR: INR: 3.1

## 2014-03-06 ENCOUNTER — Other Ambulatory Visit: Payer: Self-pay | Admitting: *Deleted

## 2014-03-06 ENCOUNTER — Ambulatory Visit (INDEPENDENT_AMBULATORY_CARE_PROVIDER_SITE_OTHER): Payer: Medicare Other | Admitting: Pharmacist

## 2014-03-06 DIAGNOSIS — G459 Transient cerebral ischemic attack, unspecified: Secondary | ICD-10-CM | POA: Diagnosis not present

## 2014-03-06 DIAGNOSIS — Z5181 Encounter for therapeutic drug level monitoring: Secondary | ICD-10-CM | POA: Diagnosis not present

## 2014-03-06 LAB — POCT INR: INR: 4.5

## 2014-03-23 ENCOUNTER — Ambulatory Visit (INDEPENDENT_AMBULATORY_CARE_PROVIDER_SITE_OTHER): Payer: Medicare Other | Admitting: *Deleted

## 2014-03-23 DIAGNOSIS — G459 Transient cerebral ischemic attack, unspecified: Secondary | ICD-10-CM

## 2014-03-23 DIAGNOSIS — R600 Localized edema: Secondary | ICD-10-CM | POA: Diagnosis not present

## 2014-03-23 DIAGNOSIS — I6350 Cerebral infarction due to unspecified occlusion or stenosis of unspecified cerebral artery: Secondary | ICD-10-CM | POA: Diagnosis not present

## 2014-03-23 DIAGNOSIS — Z5181 Encounter for therapeutic drug level monitoring: Secondary | ICD-10-CM

## 2014-03-23 DIAGNOSIS — R030 Elevated blood-pressure reading, without diagnosis of hypertension: Secondary | ICD-10-CM | POA: Diagnosis not present

## 2014-03-23 DIAGNOSIS — I872 Venous insufficiency (chronic) (peripheral): Secondary | ICD-10-CM | POA: Diagnosis not present

## 2014-03-23 DIAGNOSIS — D6861 Antiphospholipid syndrome: Secondary | ICD-10-CM | POA: Diagnosis not present

## 2014-03-23 LAB — POCT INR: INR: 3

## 2014-04-13 ENCOUNTER — Ambulatory Visit (INDEPENDENT_AMBULATORY_CARE_PROVIDER_SITE_OTHER): Payer: Medicare Other | Admitting: *Deleted

## 2014-04-13 DIAGNOSIS — Z5181 Encounter for therapeutic drug level monitoring: Secondary | ICD-10-CM

## 2014-04-13 DIAGNOSIS — G459 Transient cerebral ischemic attack, unspecified: Secondary | ICD-10-CM

## 2014-04-13 LAB — POCT INR: INR: 2.6

## 2014-05-04 ENCOUNTER — Encounter: Payer: Self-pay | Admitting: Pharmacist

## 2014-05-04 DIAGNOSIS — Z7901 Long term (current) use of anticoagulants: Secondary | ICD-10-CM | POA: Diagnosis not present

## 2014-05-04 DIAGNOSIS — Z8673 Personal history of transient ischemic attack (TIA), and cerebral infarction without residual deficits: Secondary | ICD-10-CM | POA: Diagnosis not present

## 2014-05-04 DIAGNOSIS — D6861 Antiphospholipid syndrome: Secondary | ICD-10-CM | POA: Diagnosis not present

## 2014-05-04 DIAGNOSIS — R011 Cardiac murmur, unspecified: Secondary | ICD-10-CM | POA: Diagnosis not present

## 2014-05-04 LAB — PROTIME-INR: INR: 3.1 — AB (ref 0.9–1.1)

## 2014-06-07 ENCOUNTER — Ambulatory Visit (INDEPENDENT_AMBULATORY_CARE_PROVIDER_SITE_OTHER): Payer: Medicare Other | Admitting: Pharmacist Clinician (PhC)/ Clinical Pharmacy Specialist

## 2014-06-07 DIAGNOSIS — Z5181 Encounter for therapeutic drug level monitoring: Secondary | ICD-10-CM

## 2014-06-07 DIAGNOSIS — G459 Transient cerebral ischemic attack, unspecified: Secondary | ICD-10-CM | POA: Diagnosis not present

## 2014-06-07 LAB — POCT INR: INR: 1.4

## 2014-06-28 ENCOUNTER — Ambulatory Visit (INDEPENDENT_AMBULATORY_CARE_PROVIDER_SITE_OTHER): Payer: Medicare Other | Admitting: *Deleted

## 2014-06-28 DIAGNOSIS — Z5181 Encounter for therapeutic drug level monitoring: Secondary | ICD-10-CM | POA: Diagnosis not present

## 2014-06-28 DIAGNOSIS — G459 Transient cerebral ischemic attack, unspecified: Secondary | ICD-10-CM | POA: Diagnosis not present

## 2014-06-28 LAB — POCT INR: INR: 3.4

## 2014-07-11 ENCOUNTER — Encounter: Payer: Self-pay | Admitting: *Deleted

## 2014-07-17 ENCOUNTER — Ambulatory Visit: Payer: Self-pay | Admitting: Cardiology

## 2014-07-17 DIAGNOSIS — Z5181 Encounter for therapeutic drug level monitoring: Secondary | ICD-10-CM

## 2014-07-17 NOTE — Patient Instructions (Signed)
Spoke with pt, he Follow at [facility] for [facility]. He doesn't want to establish with any of our MD's. Thus, I explained to him that per our discuss at his last visit his PCP-Dr [facility] office called [place] back and they stated they can and will start to manage his coumadin for him. Instructed pt to call tomorrow and scheduled appt for next week as that is when he is due for INR check. Pt verbalizes understanding.

## 2014-07-26 DIAGNOSIS — I639 Cerebral infarction, unspecified: Secondary | ICD-10-CM | POA: Diagnosis not present

## 2014-07-26 DIAGNOSIS — D6861 Antiphospholipid syndrome: Secondary | ICD-10-CM | POA: Diagnosis not present

## 2014-07-26 DIAGNOSIS — Z7901 Long term (current) use of anticoagulants: Secondary | ICD-10-CM | POA: Diagnosis not present

## 2014-08-02 DIAGNOSIS — M79605 Pain in left leg: Secondary | ICD-10-CM | POA: Diagnosis not present

## 2014-08-02 DIAGNOSIS — R600 Localized edema: Secondary | ICD-10-CM | POA: Diagnosis not present

## 2014-08-02 DIAGNOSIS — D6861 Antiphospholipid syndrome: Secondary | ICD-10-CM | POA: Diagnosis not present

## 2014-08-02 DIAGNOSIS — I639 Cerebral infarction, unspecified: Secondary | ICD-10-CM | POA: Diagnosis not present

## 2014-08-09 DIAGNOSIS — I639 Cerebral infarction, unspecified: Secondary | ICD-10-CM | POA: Diagnosis not present

## 2014-08-09 DIAGNOSIS — R600 Localized edema: Secondary | ICD-10-CM | POA: Diagnosis not present

## 2014-08-09 DIAGNOSIS — M79605 Pain in left leg: Secondary | ICD-10-CM | POA: Diagnosis not present

## 2014-08-09 DIAGNOSIS — D6861 Antiphospholipid syndrome: Secondary | ICD-10-CM | POA: Diagnosis not present

## 2014-08-24 DIAGNOSIS — I639 Cerebral infarction, unspecified: Secondary | ICD-10-CM | POA: Diagnosis not present

## 2014-08-24 DIAGNOSIS — Z7901 Long term (current) use of anticoagulants: Secondary | ICD-10-CM | POA: Diagnosis not present

## 2014-08-27 DIAGNOSIS — D6861 Antiphospholipid syndrome: Secondary | ICD-10-CM | POA: Diagnosis not present

## 2014-08-27 DIAGNOSIS — Z7901 Long term (current) use of anticoagulants: Secondary | ICD-10-CM | POA: Diagnosis not present

## 2014-08-27 DIAGNOSIS — I639 Cerebral infarction, unspecified: Secondary | ICD-10-CM | POA: Diagnosis not present

## 2014-09-07 DIAGNOSIS — D61818 Other pancytopenia: Secondary | ICD-10-CM | POA: Diagnosis not present

## 2014-09-07 DIAGNOSIS — Z8673 Personal history of transient ischemic attack (TIA), and cerebral infarction without residual deficits: Secondary | ICD-10-CM | POA: Diagnosis not present

## 2014-09-07 DIAGNOSIS — D62 Acute posthemorrhagic anemia: Secondary | ICD-10-CM | POA: Diagnosis not present

## 2014-09-07 DIAGNOSIS — R011 Cardiac murmur, unspecified: Secondary | ICD-10-CM | POA: Diagnosis not present

## 2014-09-07 DIAGNOSIS — D6861 Antiphospholipid syndrome: Secondary | ICD-10-CM | POA: Diagnosis not present

## 2014-09-07 DIAGNOSIS — I8290 Acute embolism and thrombosis of unspecified vein: Secondary | ICD-10-CM | POA: Diagnosis not present

## 2014-09-07 DIAGNOSIS — I517 Cardiomegaly: Secondary | ICD-10-CM | POA: Diagnosis not present

## 2014-09-07 DIAGNOSIS — Z5181 Encounter for therapeutic drug level monitoring: Secondary | ICD-10-CM | POA: Diagnosis not present

## 2014-09-07 DIAGNOSIS — Z7901 Long term (current) use of anticoagulants: Secondary | ICD-10-CM | POA: Diagnosis not present

## 2014-09-11 DIAGNOSIS — I639 Cerebral infarction, unspecified: Secondary | ICD-10-CM | POA: Diagnosis not present

## 2014-09-11 DIAGNOSIS — Z7901 Long term (current) use of anticoagulants: Secondary | ICD-10-CM | POA: Diagnosis not present

## 2014-10-19 DIAGNOSIS — D649 Anemia, unspecified: Secondary | ICD-10-CM | POA: Diagnosis not present

## 2014-10-19 DIAGNOSIS — Z7901 Long term (current) use of anticoagulants: Secondary | ICD-10-CM | POA: Diagnosis not present

## 2014-11-02 DIAGNOSIS — Z7901 Long term (current) use of anticoagulants: Secondary | ICD-10-CM | POA: Diagnosis not present

## 2014-11-02 DIAGNOSIS — I639 Cerebral infarction, unspecified: Secondary | ICD-10-CM | POA: Diagnosis not present

## 2014-11-16 DIAGNOSIS — Z7901 Long term (current) use of anticoagulants: Secondary | ICD-10-CM | POA: Diagnosis not present

## 2014-11-16 DIAGNOSIS — I639 Cerebral infarction, unspecified: Secondary | ICD-10-CM | POA: Diagnosis not present

## 2014-12-18 DIAGNOSIS — I6930 Unspecified sequelae of cerebral infarction: Secondary | ICD-10-CM | POA: Diagnosis not present

## 2014-12-21 DIAGNOSIS — Z7901 Long term (current) use of anticoagulants: Secondary | ICD-10-CM | POA: Diagnosis not present

## 2014-12-21 DIAGNOSIS — D6861 Antiphospholipid syndrome: Secondary | ICD-10-CM | POA: Diagnosis not present

## 2014-12-21 DIAGNOSIS — I639 Cerebral infarction, unspecified: Secondary | ICD-10-CM | POA: Diagnosis not present

## 2015-01-10 DIAGNOSIS — G40909 Epilepsy, unspecified, not intractable, without status epilepticus: Secondary | ICD-10-CM | POA: Diagnosis not present

## 2015-01-10 DIAGNOSIS — R000 Tachycardia, unspecified: Secondary | ICD-10-CM | POA: Diagnosis not present

## 2015-01-10 DIAGNOSIS — M1990 Unspecified osteoarthritis, unspecified site: Secondary | ICD-10-CM | POA: Diagnosis not present

## 2015-01-10 DIAGNOSIS — R471 Dysarthria and anarthria: Secondary | ICD-10-CM | POA: Diagnosis not present

## 2015-01-10 DIAGNOSIS — D6861 Antiphospholipid syndrome: Secondary | ICD-10-CM | POA: Diagnosis not present

## 2015-01-10 DIAGNOSIS — D61818 Other pancytopenia: Secondary | ICD-10-CM | POA: Diagnosis not present

## 2015-01-10 DIAGNOSIS — I253 Aneurysm of heart: Secondary | ICD-10-CM | POA: Diagnosis not present

## 2015-01-10 DIAGNOSIS — Z8673 Personal history of transient ischemic attack (TIA), and cerebral infarction without residual deficits: Secondary | ICD-10-CM | POA: Diagnosis not present

## 2015-01-10 DIAGNOSIS — Z7901 Long term (current) use of anticoagulants: Secondary | ICD-10-CM | POA: Diagnosis not present

## 2015-01-10 DIAGNOSIS — D649 Anemia, unspecified: Secondary | ICD-10-CM | POA: Diagnosis not present

## 2015-01-21 DIAGNOSIS — Z888 Allergy status to other drugs, medicaments and biological substances status: Secondary | ICD-10-CM | POA: Diagnosis not present

## 2015-01-21 DIAGNOSIS — Z87891 Personal history of nicotine dependence: Secondary | ICD-10-CM | POA: Diagnosis not present

## 2015-01-21 DIAGNOSIS — Z1211 Encounter for screening for malignant neoplasm of colon: Secondary | ICD-10-CM | POA: Diagnosis not present

## 2015-01-21 DIAGNOSIS — Z7982 Long term (current) use of aspirin: Secondary | ICD-10-CM | POA: Diagnosis not present

## 2015-01-21 DIAGNOSIS — D6861 Antiphospholipid syndrome: Secondary | ICD-10-CM | POA: Diagnosis not present

## 2015-01-21 DIAGNOSIS — K635 Polyp of colon: Secondary | ICD-10-CM | POA: Diagnosis not present

## 2015-01-21 DIAGNOSIS — Z7901 Long term (current) use of anticoagulants: Secondary | ICD-10-CM | POA: Diagnosis not present

## 2015-01-21 DIAGNOSIS — D124 Benign neoplasm of descending colon: Secondary | ICD-10-CM | POA: Diagnosis not present

## 2015-01-21 DIAGNOSIS — M1990 Unspecified osteoarthritis, unspecified site: Secondary | ICD-10-CM | POA: Diagnosis not present

## 2015-01-21 DIAGNOSIS — Z79899 Other long term (current) drug therapy: Secondary | ICD-10-CM | POA: Diagnosis not present

## 2015-01-21 DIAGNOSIS — R569 Unspecified convulsions: Secondary | ICD-10-CM | POA: Diagnosis not present

## 2015-01-21 DIAGNOSIS — Z8673 Personal history of transient ischemic attack (TIA), and cerebral infarction without residual deficits: Secondary | ICD-10-CM | POA: Diagnosis not present

## 2015-01-21 DIAGNOSIS — D649 Anemia, unspecified: Secondary | ICD-10-CM | POA: Diagnosis not present

## 2015-01-21 DIAGNOSIS — D122 Benign neoplasm of ascending colon: Secondary | ICD-10-CM | POA: Diagnosis not present

## 2015-01-21 DIAGNOSIS — D500 Iron deficiency anemia secondary to blood loss (chronic): Secondary | ICD-10-CM | POA: Diagnosis not present

## 2015-01-28 DIAGNOSIS — D649 Anemia, unspecified: Secondary | ICD-10-CM | POA: Diagnosis not present

## 2015-01-28 DIAGNOSIS — Z7901 Long term (current) use of anticoagulants: Secondary | ICD-10-CM | POA: Diagnosis not present

## 2015-02-08 DIAGNOSIS — Z7901 Long term (current) use of anticoagulants: Secondary | ICD-10-CM | POA: Diagnosis not present

## 2015-02-08 DIAGNOSIS — I639 Cerebral infarction, unspecified: Secondary | ICD-10-CM | POA: Diagnosis not present

## 2015-02-15 DIAGNOSIS — I639 Cerebral infarction, unspecified: Secondary | ICD-10-CM | POA: Diagnosis not present

## 2015-02-15 DIAGNOSIS — D6861 Antiphospholipid syndrome: Secondary | ICD-10-CM | POA: Diagnosis not present

## 2015-02-15 DIAGNOSIS — Z7901 Long term (current) use of anticoagulants: Secondary | ICD-10-CM | POA: Diagnosis not present

## 2015-03-04 DIAGNOSIS — Z7901 Long term (current) use of anticoagulants: Secondary | ICD-10-CM | POA: Diagnosis not present

## 2015-03-04 DIAGNOSIS — I639 Cerebral infarction, unspecified: Secondary | ICD-10-CM | POA: Diagnosis not present

## 2015-03-15 DIAGNOSIS — D6861 Antiphospholipid syndrome: Secondary | ICD-10-CM | POA: Diagnosis not present

## 2015-03-15 DIAGNOSIS — I87009 Postthrombotic syndrome without complications of unspecified extremity: Secondary | ICD-10-CM | POA: Diagnosis not present

## 2015-03-15 DIAGNOSIS — Z7901 Long term (current) use of anticoagulants: Secondary | ICD-10-CM | POA: Diagnosis not present

## 2015-03-29 DIAGNOSIS — I639 Cerebral infarction, unspecified: Secondary | ICD-10-CM | POA: Diagnosis not present

## 2015-03-29 DIAGNOSIS — Z7901 Long term (current) use of anticoagulants: Secondary | ICD-10-CM | POA: Diagnosis not present

## 2015-04-12 DIAGNOSIS — G40209 Localization-related (focal) (partial) symptomatic epilepsy and epileptic syndromes with complex partial seizures, not intractable, without status epilepticus: Secondary | ICD-10-CM | POA: Insufficient documentation

## 2015-04-12 DIAGNOSIS — G40201 Localization-related (focal) (partial) symptomatic epilepsy and epileptic syndromes with complex partial seizures, not intractable, with status epilepticus: Secondary | ICD-10-CM | POA: Diagnosis not present

## 2015-04-12 HISTORY — DX: Localization-related (focal) (partial) symptomatic epilepsy and epileptic syndromes with complex partial seizures, not intractable, without status epilepticus: G40.209

## 2015-05-13 DIAGNOSIS — Z7901 Long term (current) use of anticoagulants: Secondary | ICD-10-CM | POA: Diagnosis not present

## 2015-05-13 DIAGNOSIS — I639 Cerebral infarction, unspecified: Secondary | ICD-10-CM | POA: Diagnosis not present

## 2015-06-06 DIAGNOSIS — I6350 Cerebral infarction due to unspecified occlusion or stenosis of unspecified cerebral artery: Secondary | ICD-10-CM | POA: Diagnosis not present

## 2015-06-06 DIAGNOSIS — Z7901 Long term (current) use of anticoagulants: Secondary | ICD-10-CM | POA: Diagnosis not present

## 2015-06-24 DIAGNOSIS — Z7901 Long term (current) use of anticoagulants: Secondary | ICD-10-CM | POA: Diagnosis not present

## 2015-07-29 DIAGNOSIS — Z7901 Long term (current) use of anticoagulants: Secondary | ICD-10-CM | POA: Diagnosis not present

## 2015-08-05 DIAGNOSIS — Z7901 Long term (current) use of anticoagulants: Secondary | ICD-10-CM | POA: Diagnosis not present

## 2015-09-06 DIAGNOSIS — Z7901 Long term (current) use of anticoagulants: Secondary | ICD-10-CM | POA: Diagnosis not present

## 2015-10-04 DIAGNOSIS — Z7901 Long term (current) use of anticoagulants: Secondary | ICD-10-CM | POA: Diagnosis not present

## 2015-10-08 DIAGNOSIS — Z7901 Long term (current) use of anticoagulants: Secondary | ICD-10-CM | POA: Diagnosis not present

## 2015-10-11 DIAGNOSIS — Z7901 Long term (current) use of anticoagulants: Secondary | ICD-10-CM | POA: Diagnosis not present

## 2015-10-18 DIAGNOSIS — Z7901 Long term (current) use of anticoagulants: Secondary | ICD-10-CM | POA: Diagnosis not present

## 2015-11-22 DIAGNOSIS — Z7901 Long term (current) use of anticoagulants: Secondary | ICD-10-CM | POA: Diagnosis not present

## 2015-12-02 DIAGNOSIS — Z7901 Long term (current) use of anticoagulants: Secondary | ICD-10-CM | POA: Diagnosis not present

## 2016-01-03 DIAGNOSIS — Z7901 Long term (current) use of anticoagulants: Secondary | ICD-10-CM | POA: Diagnosis not present

## 2016-01-24 DIAGNOSIS — I6930 Unspecified sequelae of cerebral infarction: Secondary | ICD-10-CM | POA: Diagnosis not present

## 2016-02-04 DIAGNOSIS — Z7901 Long term (current) use of anticoagulants: Secondary | ICD-10-CM | POA: Diagnosis not present

## 2016-03-06 DIAGNOSIS — Z7901 Long term (current) use of anticoagulants: Secondary | ICD-10-CM | POA: Diagnosis not present

## 2016-03-13 DIAGNOSIS — Z7901 Long term (current) use of anticoagulants: Secondary | ICD-10-CM | POA: Diagnosis not present

## 2016-04-10 DIAGNOSIS — Z7901 Long term (current) use of anticoagulants: Secondary | ICD-10-CM | POA: Diagnosis not present

## 2016-05-08 DIAGNOSIS — I639 Cerebral infarction, unspecified: Secondary | ICD-10-CM | POA: Diagnosis not present

## 2016-05-08 DIAGNOSIS — Z7901 Long term (current) use of anticoagulants: Secondary | ICD-10-CM | POA: Diagnosis not present

## 2016-05-26 DIAGNOSIS — S01302A Unspecified open wound of left ear, initial encounter: Secondary | ICD-10-CM | POA: Diagnosis not present

## 2016-05-26 DIAGNOSIS — R030 Elevated blood-pressure reading, without diagnosis of hypertension: Secondary | ICD-10-CM | POA: Diagnosis not present

## 2016-05-26 DIAGNOSIS — D6861 Antiphospholipid syndrome: Secondary | ICD-10-CM | POA: Diagnosis not present

## 2016-05-26 DIAGNOSIS — Z72820 Sleep deprivation: Secondary | ICD-10-CM | POA: Diagnosis not present

## 2016-05-26 DIAGNOSIS — Z7901 Long term (current) use of anticoagulants: Secondary | ICD-10-CM | POA: Diagnosis not present

## 2016-06-02 DIAGNOSIS — D6861 Antiphospholipid syndrome: Secondary | ICD-10-CM | POA: Diagnosis not present

## 2016-06-02 DIAGNOSIS — Z7901 Long term (current) use of anticoagulants: Secondary | ICD-10-CM | POA: Diagnosis not present

## 2016-06-02 DIAGNOSIS — R600 Localized edema: Secondary | ICD-10-CM | POA: Diagnosis not present

## 2016-06-02 DIAGNOSIS — I10 Essential (primary) hypertension: Secondary | ICD-10-CM | POA: Diagnosis not present

## 2016-10-02 DIAGNOSIS — R29898 Other symptoms and signs involving the musculoskeletal system: Secondary | ICD-10-CM | POA: Diagnosis not present

## 2016-10-02 DIAGNOSIS — M47812 Spondylosis without myelopathy or radiculopathy, cervical region: Secondary | ICD-10-CM | POA: Diagnosis not present

## 2016-10-02 DIAGNOSIS — R531 Weakness: Secondary | ICD-10-CM | POA: Diagnosis not present

## 2016-10-02 DIAGNOSIS — M542 Cervicalgia: Secondary | ICD-10-CM | POA: Diagnosis not present

## 2016-10-21 DIAGNOSIS — M4802 Spinal stenosis, cervical region: Secondary | ICD-10-CM | POA: Diagnosis not present

## 2016-10-21 DIAGNOSIS — M40202 Unspecified kyphosis, cervical region: Secondary | ICD-10-CM | POA: Diagnosis not present

## 2016-10-30 DIAGNOSIS — M4802 Spinal stenosis, cervical region: Secondary | ICD-10-CM | POA: Diagnosis not present

## 2016-10-30 DIAGNOSIS — M5412 Radiculopathy, cervical region: Secondary | ICD-10-CM | POA: Diagnosis not present

## 2016-10-30 DIAGNOSIS — M40202 Unspecified kyphosis, cervical region: Secondary | ICD-10-CM | POA: Diagnosis not present

## 2016-11-26 DIAGNOSIS — M40202 Unspecified kyphosis, cervical region: Secondary | ICD-10-CM | POA: Diagnosis not present

## 2016-11-26 DIAGNOSIS — M542 Cervicalgia: Secondary | ICD-10-CM | POA: Diagnosis not present

## 2016-11-26 DIAGNOSIS — M5030 Other cervical disc degeneration, unspecified cervical region: Secondary | ICD-10-CM | POA: Diagnosis not present

## 2016-11-26 DIAGNOSIS — R29898 Other symptoms and signs involving the musculoskeletal system: Secondary | ICD-10-CM | POA: Diagnosis not present

## 2016-11-26 DIAGNOSIS — M4856XA Collapsed vertebra, not elsewhere classified, lumbar region, initial encounter for fracture: Secondary | ICD-10-CM | POA: Diagnosis not present

## 2016-11-26 DIAGNOSIS — Y33XXXA Other specified events, undetermined intent, initial encounter: Secondary | ICD-10-CM | POA: Diagnosis not present

## 2016-11-26 DIAGNOSIS — I517 Cardiomegaly: Secondary | ICD-10-CM | POA: Diagnosis not present

## 2017-03-05 DIAGNOSIS — R011 Cardiac murmur, unspecified: Secondary | ICD-10-CM | POA: Diagnosis not present

## 2017-03-05 DIAGNOSIS — R600 Localized edema: Secondary | ICD-10-CM | POA: Diagnosis not present

## 2017-03-05 DIAGNOSIS — Z7901 Long term (current) use of anticoagulants: Secondary | ICD-10-CM | POA: Diagnosis not present

## 2017-03-05 DIAGNOSIS — Z8673 Personal history of transient ischemic attack (TIA), and cerebral infarction without residual deficits: Secondary | ICD-10-CM | POA: Diagnosis not present

## 2017-03-05 DIAGNOSIS — D696 Thrombocytopenia, unspecified: Secondary | ICD-10-CM | POA: Diagnosis not present

## 2017-03-05 DIAGNOSIS — D699 Hemorrhagic condition, unspecified: Secondary | ICD-10-CM | POA: Diagnosis not present

## 2017-03-05 DIAGNOSIS — D649 Anemia, unspecified: Secondary | ICD-10-CM | POA: Diagnosis not present

## 2017-03-22 DIAGNOSIS — R600 Localized edema: Secondary | ICD-10-CM | POA: Diagnosis not present

## 2017-04-09 ENCOUNTER — Ambulatory Visit: Payer: Medicare Other | Admitting: Adult Health

## 2017-04-13 ENCOUNTER — Ambulatory Visit: Payer: Medicare Other | Admitting: Adult Health

## 2017-04-13 ENCOUNTER — Ambulatory Visit: Payer: Medicare Other | Attending: Physician Assistant

## 2017-04-13 DIAGNOSIS — R293 Abnormal posture: Secondary | ICD-10-CM | POA: Diagnosis not present

## 2017-04-13 DIAGNOSIS — M6281 Muscle weakness (generalized): Secondary | ICD-10-CM | POA: Diagnosis not present

## 2017-04-13 NOTE — Patient Instructions (Addendum)
Posture - Standing   Good posture is important. Avoid slouching and forward head thrust. Maintain curve in low back and align ears over shoulders, hips over ankles.  Pull your belly button in toward your back bone. Posture Tips DO: - stand tall and erect - keep chin tucked in - keep head and shoulders in alignment - check posture regularly in mirror or large window - pull head back against headrest in car seat;  Change your position often.  Sit with lumbar support. DON'T: - slouch or slump while watching TV or reading - sit, stand or lie in one position  for too long;  Sitting is especially hard on the spine so if you sit at a desk/use the computer, then stand up often! Copyright  VHI. All rights reserved.  Posture - Sitting  Sit upright, head facing forward. Try using a roll to support lower back. Keep shoulders relaxed, and avoid rounded back. Keep hips level with knees. Avoid crossing legs for long periods. Copyright  VHI. All rights reserved.  Chronic neck strain can develop because of poor posture and faulty work habits  Postural strain related to slumped sitting and forward head posture is a leading cause of headaches, neck and upper back pain  General strengthening and flexibility exercises are helpful in the treatment of neck pain.  Most importantly, you should learn to correct the posture that may be contributing to chronic pain.   Change positions frequently  Change your work or home environment to improve posture and mechanics.   Neck: Retraction    Sit with back straight or lie on firm surface. Place hands, with fingers locked, behind head. Keep head in neutral position. Push back with head while resisting with hands. Do not let head actually move. Hold _5___ seconds. Repeat _10___ times. Do __many__ sessions per day. CAUTION: Pressure should be steady.  Copyright  VHI. All rights reserved.   Sit in front of the mirror: hold head in neutral as long as you can before  muscles get tired.  Do this often during the day.    [place] [address] [place], [place] [address] Phone # [phone] Fax [phone]

## 2017-04-13 NOTE — Therapy (Signed)
[facility] [address], [place], [place], [address] Phone: [phone]   Fax:  [phone]  Physical Therapy Evaluation  Patient Details  Name: [name] MRN: [id number] Date of Birth: 03-31-1941 Referring Provider: Robyne Askew, PA   Encounter Date: 04/13/2017  PT End of Session - 04/13/17 1154    Visit Number  1    Number of Visits  10    Date for PT Re-Evaluation  06/08/17    Authorization Type  G-codes at 10    PT Start Time  September 10, 1103    PT Stop Time  1147    PT Time Calculation (min)  42 min    Activity Tolerance  Patient tolerated treatment well    Behavior During Therapy  [facility] for tasks assessed/performed       History reviewed. No pertinent past medical history.  History reviewed. No pertinent surgical history.  There were no vitals filed for this visit.   Subjective Assessment - 04/13/17 1109    Subjective  Pt presents to PT with diagnosis of dropped head syndrome.  Weakness began 09/2016 without cause.  Pt reports that the MD thought this would get better and it has gotten worse. Pt wears a hard cervical collar to help keep head erect.      Pertinent History  dropped head syndrome    Limitations  Sitting    How long can you sit comfortably?  not able to hold head upright- has to wear brace for all activity    Diagnostic tests  MRI and CT: brain results were negative    Patient Stated Goals  improve neck strength to hold head upright    Currently in Pain?  No/denies         [facility] PT Assessment - 04/13/17 0001      Assessment   Medical Diagnosis  kyphosis of cervical region, unspecified kyphosis, dropped head syndrome, cervicalgia    Referring Provider  Robyne Askew, PA    Onset Date/Surgical Date  09/11/16    Next MD Visit  04/2017    Prior Therapy  none      Precautions   Precautions  None      Restrictions   Weight Bearing Restrictions  No      Balance Screen   Has the patient  fallen in the past 6 months  No    Has the patient had a decrease in activity level because of a fear of falling?   No    Is the patient reluctant to leave their home because of a fear of falling?   No      Home Environment   Living Environment  Private residence    Living Arrangements  Alone    Type of [place] to enter    Home Layout  Two level      Prior Function   Level of [place]  Retired    Leisure  used to walk- not able to walk due to dropped head      Cognition   Overall Cognitive Status  Within Functional Limits for tasks assessed      Observation/Other Assessments   Focus on Therapeutic Outcomes (FOTO)   55% limitation      Posture/Postural Control   Posture/Postural Control  Postural limitations    Postural Limitations  Forward head;Rounded Shoulders;Weight shift left    Posture Comments  cervical  kyphosis      ROM / Strength   AROM / PROM / Strength  AROM;Strength;PROM      AROM   Overall AROM   Deficits    Overall AROM Comments  UE A/ROM is full with pain reported at end range    AROM Assessment Site  Cervical    Cervical Flexion  -- full   full   Cervical Extension  10    Cervical - Right Side Bend  0    Cervical - Left Side Bend  30    Cervical - Right Rotation  70 in cervical flexion   in cervical flexion   Cervical - Left Rotation  70 in cervical flexion   in cervical flexion     PROM   Overall PROM   Deficits    Overall PROM Comments  cervical PROM: extension limited by 75%, sidebending limited by 50% to the Rt, 25% limitation to the Lt, rotation is full bil.        Strength   Overall Strength  Within functional limits for tasks performed    Overall Strength Comments  5/5 UE strength bilaterally      Palpation   Palpation comment  no palpable tenderness today      Ambulation/Gait   Ambulation/Gait  Yes    Gait Pattern  Within Functional Limits    Ambulation Surface  Level    Gait  Comments  with flexed cervical spine             Objective measurements completed on examination: See above findings.              PT Education - 04/13/17 1147    Education provided  Yes    Education Details  posture education, posture practice, isometric extension    Person(s) Educated  Patient    Methods  Explanation;Demonstration;Handout    Comprehension  Verbalized understanding;Returned demonstration;Verbal cues required;Tactile cues required       PT Short Term Goals - 04/13/17 1201      PT SHORT TERM GOAL #1   Title  be independent in initial HEP    Time  4    Period  Weeks    Status  New    Target Date  05/11/17      PT SHORT TERM GOAL #2   Title  sit with neutral posture for 3-5 minutes to improve cervical strength and endurance    Time  4    Period  Weeks    Status  New    Target Date  05/11/17      PT SHORT TERM GOAL #3   Title  wean from cervical collar > or = to 25% of the time due to improve cervical strength    Time  4    Period  Weeks    Status  New    Target Date  05/11/17        PT Long Term Goals - 04/13/17 1202      PT LONG TERM GOAL #1   Title  be independent in advanced HEP    Time  8    Period  Weeks    Target Date  06/08/17      PT LONG TERM GOAL #2   Title  reduce FOTO to < or = to 39% limitation    Time  8    Period  Weeks    Status  New    Target Date  06/08/17  PT LONG TERM GOAL #3   Title  wean from cervical brace > or = to 50% of the time due to improved cervical strength    Time  8    Period  Weeks    Status  New    Target Date  06/08/17      PT LONG TERM GOAL #4   Title  demonstrate > or = to 25 degrees of cervical extension to allow for looking overhead    Time  8    Period  Weeks    Status  New    Target Date  06/08/17      PT LONG TERM GOAL #5   Title  demonstrate > or = to 45 degrees of A/ROM cervical sidebending     Time  8    Period  Weeks    Status  New    Target Date  06/08/17       Additional Long Term Goals   Additional Long Term Goals  Yes      PT LONG TERM GOAL #6   Title  sit with neutral cervical posture for > 10 minutes due to improved strength and endurance    Time  8    Period  Weeks    Status  New    Target Date  06/08/17             Plan - 04/13/17 1155    Clinical Impression Statement  Pt presents to PT with dorpped head syndrome that has been present since April 2018 and began without cause.  Pt has had imaging and reports that MD is not sure of cause and thought that the problem would have resolved by now.  Pt has been wearing a hard cervical collar due to inability to hold head upright.  Pt presents with flexed cervical posture and rests in Lt sidebending.  Pt denies any pain in the neck and reports some Rt UE radiculopathy at times.  Pt demonstrates 10 degrees of cervical extension, 30 degrees Lt sidebending and 0 degrees of Rt sidebending actively.  Pt can sit with neutral seated posture with use of mirror for feedback for < 1 minute before he begins to flex his head.  Pt will benefit from skilled PT for postural retraining, cervical and thoracic postural endurance exercises and cervical mobility to allow to wean from brace.    Clinical Presentation  Stable    Clinical Presentation due to:  no change since April 2018    Clinical Decision Making  Low    Rehab Potential  Good    PT Frequency  2x / week    PT Duration  8 weeks    PT Treatment/Interventions  ADLs/Self Care Home Management;Cryotherapy;Electrical Stimulation;Ultrasound;Traction;Therapeutic activities;Therapeutic exercise;Neuromuscular re-education;Manual techniques;Passive range of motion;Taping    PT Next Visit Plan  Posture retraining using mirror for cervical posture, try taping for feedback, scapular strength (rows, extension), arm bike with emphasis on neutral head posture    Consulted and Agree with Plan of Care  Patient       Patient will benefit from skilled therapeutic  intervention in order to improve the following deficits and impairments:  Impaired flexibility, Decreased activity tolerance, Decreased range of motion, Decreased strength, Postural dysfunction, Improper body mechanics  Visit Diagnosis: Muscle weakness (generalized) - Plan: PT plan of care cert/re-cert  Abnormal posture - Plan: PT plan of care cert/re-cert  G-Codes - 52/84/13 1206    Functional Assessment Tool Used (Outpatient Only)  FOTO: 55%  limitaiton    Functional Limitation  Carrying, moving and handling objects    Carrying, Moving and Handling Objects Current Status [phone])  At least 40 percent but less than 60 percent impaired, limited or restricted    Carrying, Moving and Handling Objects Goal Status (T1438)  At least 20 percent but less than 40 percent impaired, limited or restricted        Problem List Patient Active Problem List   Diagnosis Date Noted  . Encounter for therapeutic drug monitoring 06/30/2013  . Long term (current) use of anticoagulants 11/14/2010  . TIA 02/25/2009     Sigurd Sos, PT 04/13/17 12:10 PM  [facility] [address] [place], [place], [address] Phone: [phone]   Fax:  [phone]  Name: [name] MRN: [id number] Date of Birth: 22-Oct-1940

## 2017-04-14 ENCOUNTER — Ambulatory Visit (INDEPENDENT_AMBULATORY_CARE_PROVIDER_SITE_OTHER): Payer: Medicare Other | Admitting: Adult Health

## 2017-04-14 ENCOUNTER — Ambulatory Visit (INDEPENDENT_AMBULATORY_CARE_PROVIDER_SITE_OTHER): Payer: Medicare Other | Admitting: General Practice

## 2017-04-14 ENCOUNTER — Encounter: Payer: Self-pay | Admitting: Adult Health

## 2017-04-14 VITALS — BP 182/72 | Temp 97.7°F | Wt 190.0 lb

## 2017-04-14 DIAGNOSIS — Z7901 Long term (current) use of anticoagulants: Secondary | ICD-10-CM | POA: Diagnosis not present

## 2017-04-14 DIAGNOSIS — Z7689 Persons encountering health services in other specified circumstances: Secondary | ICD-10-CM

## 2017-04-14 LAB — POCT INR: INR: 2.9

## 2017-04-14 NOTE — Progress Notes (Signed)
Patient presents to clinic today to establish care. He is a pleasant 76 year old male who  has a past medical history of Chicken pox and Stroke ([place]) (2000).  Unsure of when his physical was done, possibly 2015.   Acute Concerns: Establish Care   Chronic Issues: Stroke - Had in 2000. He is currently on Coumadin. He has being seen at [facility] Coumadin clinic in the past but will transfer care here. His last INR was 1.8. Current coumadin dose is 5 mg M/F and 7.5 mg Tu/W/Th/Sa/Sun  kyphosis - Followed by [facility]. He is currently wearing a hard cervical collar and is working through physical therapy. He does not notice much improvement.   Health Maintenance: Dental -- Does not see on a routine basis  Vision -- Does not see on a routine basis  Immunizations -- UTD  Colonoscopy --2016   Is followed by  [facility] - [place]   Past Medical History:  Diagnosis Date  . Chicken pox   . Stroke [facility]) 2000    Past Surgical History:  Procedure Laterality Date  . HERNIA REPAIR Left 2008    Current Outpatient Medications on File Prior to Visit  Medication Sig Dispense Refill  . Ascorbic Acid (VITAMIN C) 1000 MG tablet Take 1,000 mg daily by mouth.    Marland Kitchen BETAINE, TRIMETHYLGLYCINE, PO Take by mouth.    . Calcium Carbonate (CALCIUM 600 PO) Take 1 tablet daily by mouth.    . cholecalciferol (VITAMIN D) 1000 units tablet Take 1,000 Units daily by mouth.    . niacin 100 MG tablet Take 50 mg daily by mouth.    . Pyridoxine HCl (B-6 PO) Take 50 mg daily by mouth.    . vitamin B-12 (CYANOCOBALAMIN) 1000 MCG tablet Take 1,000 mcg daily by mouth.    . vitamin E 400 UNIT capsule Take 400 Units daily by mouth.    . warfarin (COUMADIN) 5 MG tablet Take 1 tablet (5 mg total) by mouth as directed. 120 tablet 1  . Zinc 50 MG CAPS Take 1 capsule daily by mouth.     No current facility-administered  medications on file prior to visit.     Allergies  Allergen Reactions  . Aspirin     Other reaction(s): Other (See Comments) Other Reaction: GI Upset    Family History  Family history unknown: Yes    Social History   Socioeconomic History  . Marital status: Married    Spouse name: Not on file  . Number of children: Not on file  . Years of education: Not on file  . Highest education level: Not on file  Social Needs  . Financial resource strain: Not on file  . Food insecurity - worry: Not on file  . Food insecurity - inability: Not on file  . Transportation needs - medical: Not on file  . Transportation needs - non-medical: Not on file  Occupational History  . Not on file  Tobacco Use  . Smoking status: Never Smoker  . Smokeless tobacco: Never Used  Substance and Sexual Activity  . Alcohol use: No    Frequency: Never  . Drug use: No  . Sexual activity: Not on file  Other Topics Concern  . Not on file  Social History Narrative  . Not on file    Review of Systems  Constitutional: Negative.   Respiratory: Negative.   Cardiovascular:  Negative.   Genitourinary: Negative.   Musculoskeletal: Positive for neck pain.  Neurological: Positive for focal weakness.  All other systems reviewed and are negative.   BP (!) 182/72 (BP Location: Left Arm)   Temp 97.7 F (36.5 C) (Oral)   Wt 190 lb (86.2 kg)   Physical Exam  Constitutional: He is oriented to person, place, and time and well-developed, well-nourished, and in no distress. No distress.  HENT:  Wearing hard cervical collar for kyphosis   Neck: Normal range of motion. Neck supple. No thyromegaly present.  Cardiovascular: Normal rate, regular rhythm and intact distal pulses. PMI is displaced. Exam reveals no gallop and no friction rub.  Murmur heard.  Systolic murmur is present with a grade of 2/6. Pulmonary/Chest: Effort normal and breath sounds normal. No respiratory distress. He has no wheezes. He has no  rales. He exhibits no tenderness.  Musculoskeletal: He exhibits edema (bilateral lower extremity ).  Lymphadenopathy:    He has no cervical adenopathy.  Neurological: He is alert and oriented to person, place, and time. He displays weakness. He displays normal reflexes. No cranial nerve deficit. He exhibits normal muscle tone. Gait normal. Coordination normal. GCS score is 15.  Mild right side weakness in upper extremity with mild flaccidity. 4/5 grip strength in right arm and 5/5 grip strength in left arm.   5/5 strength in right and left lower extremity  Skin: Skin is warm and dry. No rash noted. He is not diaphoretic. No erythema. No pallor.  Psychiatric: Mood, memory, affect and judgment normal.  Nursing note and vitals reviewed.  Assessment/Plan:  1. Encounter to establish care - Follow up for CPE by the end of the year  - Continue with follow up appointments with various specialties at [facility]   2. Long term (current) use of anticoagulants - Will get established with [facility] INR  Dorothyann Peng, NP

## 2017-04-14 NOTE — Patient Instructions (Signed)
Pre visit review using our clinic review tool, if applicable. No additional management support is needed unless otherwise documented below in the visit note. 

## 2017-04-14 NOTE — Patient Instructions (Signed)
It was great meeting you today   Please go to the lab to get your INR checked and get on the schedule for the coumadin clinic   Also, please follow up for your yearly exam   If you need anything in the meantime, please let me know

## 2017-04-14 NOTE — Progress Notes (Signed)
I agree with this plan.

## 2017-04-15 ENCOUNTER — Ambulatory Visit: Payer: Medicare Other | Admitting: Rehabilitative and Restorative Service Providers"

## 2017-04-15 DIAGNOSIS — G40209 Localization-related (focal) (partial) symptomatic epilepsy and epileptic syndromes with complex partial seizures, not intractable, without status epilepticus: Secondary | ICD-10-CM | POA: Diagnosis not present

## 2017-04-15 DIAGNOSIS — Z87891 Personal history of nicotine dependence: Secondary | ICD-10-CM | POA: Diagnosis not present

## 2017-04-15 DIAGNOSIS — I6930 Unspecified sequelae of cerebral infarction: Secondary | ICD-10-CM | POA: Diagnosis not present

## 2017-04-15 DIAGNOSIS — Z23 Encounter for immunization: Secondary | ICD-10-CM | POA: Diagnosis not present

## 2017-04-16 ENCOUNTER — Ambulatory Visit: Payer: Medicare Other | Admitting: Physical Therapy

## 2017-04-16 DIAGNOSIS — M6281 Muscle weakness (generalized): Secondary | ICD-10-CM

## 2017-04-16 DIAGNOSIS — R293 Abnormal posture: Secondary | ICD-10-CM

## 2017-04-16 NOTE — Therapy (Signed)
[facility] [address] [place], [place], [address] Phone: [phone]   Fax:  [phone]  Physical Therapy Treatment  Patient Details  Name: [name] MRN: [id number] Date of Birth: 05/17/1940 Referring Provider: Robyne Askew, PA   Encounter Date: 04/16/2017  PT End of Session - 04/16/17 1155    Visit Number  2    Number of Visits  10    Date for PT Re-Evaluation  06/08/17    Authorization Type  G-codes at 10    PT Start Time  09-13-06    PT Stop Time  1146    PT Time Calculation (min)  38 min    Activity Tolerance  Patient tolerated treatment well       Past Medical History:  Diagnosis Date  . Antiphospholipid syndrome ([place]) 05/06/2012   Overview:    A. Left MCA stroke, Sep 13, 1998   B. Recurrent MCA stroke while subtherapeutic on warfarin, 2000   C. Positive antiphospholipid antibody testing  . Chicken pox   . Chronic anticoagulation 05/06/2012  . Complex partial seizure ([place]) 04/12/2015  . 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 Kyphosis   . Stroke [facility]) Sep 13, 1998    Past Surgical History:  Procedure Laterality Date  . HERNIA REPAIR Left 2006-09-13    There were no vitals filed for this visit.  Subjective Assessment - 04/16/17 1135    Subjective  Patient states he has not been wearing his brace much lately.      Currently in Pain?  No/denies                      [facility] Adult PT Treatment/Exercise - 04/16/17 0001      Therapeutic Activites    Therapeutic Activities  ADL's    ADL's  holding head erect with UE reaching, pulling, pushing, walking, sitting       Neuro Re-ed    Neuro Re-ed Details   cervical extensor muscle activation      Neck Exercises: Machines for Strengthening   UBE (Upper Arm Bike)  3 minutes with purple spiky ball under chin and tucked into shirt collar forward and backward L0       Neck Exercises: Standing   Other Standing Exercises  back against wall with spiky  purple ball under chin with blue ball back of head:  head press; isometric hip extension 5x each side mirror feedback;  snow angels 10x    Other Standing Exercises  facing wall with forehead on blue ball with UE wall slides 10x;  then holding upright position 3 minutes      Neck Exercises: Seated   Other Seated Exercise  purple spiky ball under chin and blue ball behind head:  UE movements shoulder flexion; yellow band diagonals 10x; red band rows and extensions 10x each               PT Short Term Goals - 04/13/17 1201      PT SHORT TERM GOAL #1   Title  be independent in initial HEP    Time  4    Period  Weeks    Status  New    Target Date  05/11/17      PT SHORT TERM GOAL #2   Title  sit with neutral posture for 3-5 minutes to improve cervical strength and endurance    Time  4    Period  Weeks    Status  New  Target Date  05/11/17      PT SHORT TERM GOAL #3   Title  wean from cervical collar > or = to 25% of the time due to improve cervical strength    Time  4    Period  Weeks    Status  New    Target Date  05/11/17        PT Long Term Goals - 04/13/17 1202      PT LONG TERM GOAL #1   Title  be independent in advanced HEP    Time  8    Period  Weeks    Target Date  06/08/17      PT LONG TERM GOAL #2   Title  reduce FOTO to < or = to 39% limitation    Time  8    Period  Weeks    Status  New    Target Date  06/08/17      PT LONG TERM GOAL #3   Title  wean from cervical brace > or = to 50% of the time due to improved cervical strength    Time  8    Period  Weeks    Status  New    Target Date  06/08/17      PT LONG TERM GOAL #4   Title  demonstrate > or = to 25 degrees of cervical extension to allow for looking overhead    Time  8    Period  Weeks    Status  New    Target Date  06/08/17      PT LONG TERM GOAL #5   Title  demonstrate > or = to 45 degrees of A/ROM cervical sidebending     Time  8    Period  Weeks    Status  New    Target Date   06/08/17      Additional Long Term Goals   Additional Long Term Goals  Yes      PT LONG TERM GOAL #6   Title  sit with neutral cervical posture for > 10 minutes due to improved strength and endurance    Time  8    Period  Weeks    Status  New    Target Date  06/08/17            Plan - 04/16/17 1155    Clinical Impression Statement  The patient denies pain before, during or after treatment session.  He does feel tired in cervical extensor muscles following exercise.  He needs some support under his chin with the majority 80% of exercises secondary to muscular weakness and fatigue.      Rehab Potential  Good    PT Frequency  2x / week    PT Treatment/Interventions  ADLs/Self Care Home Management;Cryotherapy;Electrical Stimulation;Ultrasound;Traction;Therapeutic activities;Therapeutic exercise;Neuromuscular re-education;Manual techniques;Passive range of motion;Taping    PT Next Visit Plan  Posture retraining using mirror for cervical posture, try taping for feedback, scapular strength (rows, extension), arm bike with emphasis on neutral head posture;  ball under chin for support  as needed       Patient will benefit from skilled therapeutic intervention in order to improve the following deficits and impairments:  Impaired flexibility, Decreased activity tolerance, Decreased range of motion, Decreased strength, Postural dysfunction, Improper body mechanics  Visit Diagnosis: Muscle weakness (generalized)  Abnormal posture     Problem List Patient Active Problem List   Diagnosis Date Noted  . Complex partial seizure (  [place]) 04/12/2015  . Encounter for therapeutic drug monitoring 06/30/2013  . Stroke ([place]) 06/12/2013  . Arthritis 08/26/2012  . Antiphospholipid syndrome ([place]) 05/06/2012  . 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 Chronic anticoagulation 05/06/2012  . Long term (current) use of anticoagulants 11/14/2010  . TIA 02/25/2009   Ruben Im, PT 04/16/17 12:01  PM Phone: [phone] Fax: [phone]  Alvera Singh 04/16/2017, 12:00 PM  [facility] [address], [place], [place], [address] Phone: [phone]   Fax:  [phone]  Name: [name] MRN: [id number] Date of Birth: 05/17/1940

## 2017-04-20 ENCOUNTER — Ambulatory Visit: Payer: Medicare Other | Admitting: Physical Therapy

## 2017-04-20 DIAGNOSIS — M6281 Muscle weakness (generalized): Secondary | ICD-10-CM

## 2017-04-20 DIAGNOSIS — R293 Abnormal posture: Secondary | ICD-10-CM | POA: Diagnosis not present

## 2017-04-20 NOTE — Therapy (Signed)
[facility] [address] [place], [place], [address] Phone: [phone]   Fax:  [phone]  Physical Therapy Treatment  Patient Details  Name: [name] MRN: [id number] Date of Birth: 05/17/1940 Referring Provider: Robyne Askew, PA   Encounter Date: 04/20/2017  PT End of Session - 04/20/17 0952    Visit Number  3    Number of Visits  10    Date for PT Re-Evaluation  06/08/17    Authorization Type  G-codes at 10    PT Start Time  0946    PT Stop Time  1015    PT Time Calculation (min)  29 min    Activity Tolerance  Patient tolerated treatment well;No increased pain    Behavior During Therapy  WFL for tasks assessed/performed       Past Medical History:  Diagnosis Date  . Antiphospholipid syndrome ([place]) 05/06/2012   Overview:    A. Left MCA stroke, Mar 2000   B. Recurrent MCA stroke while subtherapeutic on warfarin, 2000   C. Positive antiphospholipid antibody testing  . Chicken pox   . Chronic anticoagulation 05/06/2012  . Complex partial seizure ([place]) 04/12/2015  . 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 Kyphosis   . Stroke [facility]) 2000    Past Surgical History:  Procedure Laterality Date  . HERNIA REPAIR Left 2008    There were no vitals filed for this visit.  Subjective Assessment - 04/20/17 0950    Subjective  Pt arrives stating things are going ok. No pain currently.    Currently in Pain?  No/denies         [facility] PT Assessment - 04/20/17 0001      Strength   Overall Strength Comments  Rt wrist extension 3/5 MMT, grip strength Rt 22 lb, Lt 45 lb                   [facility] Adult PT Treatment/Exercise - 04/20/17 0001      Neck Exercises: Machines for Strengthening   UBE (Upper Arm Bike)  2 min forward/backward, L2 with purple ball underneath chin  therapist present to provide intermittent tactile cues      Neck Exercises: Supine   Neck Retraction  20 reps;3 secs;Other  (comment) heavy tactile/verbal cues    Lateral Flexion  15 reps;Both;Other (comment) therapist AAROM    Other Supine Exercise  horizontal abduction with head pressed into pillow x15 reps, red TB       Manual Therapy   Manual Therapy  Soft tissue mobilization;Passive ROM    Soft tissue mobilization  sub occipital release, STM Lt and Rt SCM     Passive ROM  AAROM into lateral flexion             PT Education - 04/20/17 1048    Education provided  Yes    Education Details  discussed pillow set up at home    Person(s) Educated  Patient    Methods  Explanation    Comprehension  Verbalized understanding       PT Short Term Goals - 04/13/17 1201      PT SHORT TERM GOAL #1   Title  be independent in initial HEP    Time  4    Period  Weeks    Status  New    Target Date  05/11/17      PT SHORT TERM GOAL #2   Title  sit with neutral posture for  3-5 minutes to improve cervical strength and endurance    Time  4    Period  Weeks    Status  New    Target Date  05/11/17      PT SHORT TERM GOAL #3   Title  wean from cervical collar > or = to 25% of the time due to improve cervical strength    Time  4    Period  Weeks    Status  New    Target Date  05/11/17        PT Long Term Goals - 04/13/17 1202      PT LONG TERM GOAL #1   Title  be independent in advanced HEP    Time  8    Period  Weeks    Target Date  06/08/17      PT LONG TERM GOAL #2   Title  reduce FOTO to < or = to 39% limitation    Time  8    Period  Weeks    Status  New    Target Date  06/08/17      PT LONG TERM GOAL #3   Title  wean from cervical brace > or = to 50% of the time due to improved cervical strength    Time  8    Period  Weeks    Status  New    Target Date  06/08/17      PT LONG TERM GOAL #4   Title  demonstrate > or = to 25 degrees of cervical extension to allow for looking overhead    Time  8    Period  Weeks    Status  New    Target Date  06/08/17      PT LONG TERM GOAL #5    Title  demonstrate > or = to 45 degrees of A/ROM cervical sidebending     Time  8    Period  Weeks    Status  New    Target Date  06/08/17      Additional Long Term Goals   Additional Long Term Goals  Yes      PT LONG TERM GOAL #6   Title  sit with neutral cervical posture for > 10 minutes due to improved strength and endurance    Time  8    Period  Weeks    Status  New    Target Date  06/08/17            Plan - 04/20/17 1113    Clinical Impression Statement  Today's session was limited due to pt's late arrival. He continues to demonstrate significant forward head posturing and difficulty with cervical musculature. Completed a majority of today's exercises and manual techniques in supine, pt requiring heavy tactile cuing to decrease sternocleidomastoid activation during several exercises. Also assessed pt's grip strength, noting ~20-25 lb difference between the Lt and Rt hands. Will continue to address pt's limitations to improve postural strength and neuromuscular control.      Rehab Potential  Good    PT Frequency  2x / week    PT Treatment/Interventions  ADLs/Self Care Home Management;Cryotherapy;Electrical Stimulation;Ultrasound;Traction;Therapeutic activities;Therapeutic exercise;Neuromuscular re-education;Manual techniques;Passive range of motion;Taping    PT Next Visit Plan  Posture retraining using mirror for cervical posture, try taping for feedback, scapular strength (rows, extension), arm bike with emphasis on neutral head posture;  ball under chin for support  as needed    Consulted and Agree with  Plan of Care  Patient       Patient will benefit from skilled therapeutic intervention in order to improve the following deficits and impairments:  Impaired flexibility, Decreased activity tolerance, Decreased range of motion, Decreased strength, Postural dysfunction, Improper body mechanics  Visit Diagnosis: Muscle weakness (generalized)  Abnormal posture     Problem  List Patient Active Problem List   Diagnosis Date Noted  . Complex partial seizure ([place]) 04/12/2015  . Encounter for therapeutic drug monitoring 06/30/2013  . Stroke ([place]) 06/12/2013  . Arthritis 08/26/2012  . Antiphospholipid syndrome ([place]) 05/06/2012  . 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 Chronic anticoagulation 05/06/2012  . Long term (current) use of anticoagulants 11/14/2010  . TIA 02/25/2009    11:39 AM,04/20/17 Elly Modena PT, DPT [facility] [address] [place], [place], [address] Phone: [phone]   Fax:  [phone]  Name: LUVERNE ZERKLE MRN: [id number] Date of Birth: 05/17/1940

## 2017-04-20 NOTE — Patient Instructions (Signed)
  HIP ABDUCTION - STANDING   While standing, raise your leg out to the side. Keep your knee straight and maintain your toes pointed forward the entire time.    Use your arms for support if needed for balance and safety.     2x15 reps each side.    [facility] [address], [place], [place] [address] Phone # [phone] Fax [phone]

## 2017-04-22 ENCOUNTER — Ambulatory Visit: Payer: Medicare Other | Admitting: Physical Therapy

## 2017-04-22 DIAGNOSIS — M6281 Muscle weakness (generalized): Secondary | ICD-10-CM | POA: Diagnosis not present

## 2017-04-22 DIAGNOSIS — R293 Abnormal posture: Secondary | ICD-10-CM | POA: Diagnosis not present

## 2017-04-22 NOTE — Patient Instructions (Signed)
  TOWEL GRIP  Place a rolled up towel in your hand and squeeze.  Hold 5 sec, repeat 15 times      CERVICAL CHIN TUCK  AND RETRACTION - SUPINE WITH TOWEL  While lying on your back with a small folded up towel under your head, tuck your chin towards your chest. Also, focus on putting pressure on the towel with the back of your head.   Maintain contact of head with the towel the entire time. x15 reps in morning and at night.    [facility] [address], [place], [place] [address] Phone # [phone] Fax [phone]

## 2017-04-22 NOTE — Therapy (Signed)
[facility] [address] [place], [place], [address] Phone: [phone]   Fax:  [phone]  Physical Therapy Treatment  Patient Details  Name: [name] MRN: [id number] Date of Birth: 05/17/1940 Referring Provider: Robyne Askew, PA   Encounter Date: 04/22/2017  PT End of Session - 04/22/17 1118    Visit Number  4    Number of Visits  10    Date for PT Re-Evaluation  06/08/17    Authorization Type  G-codes at 10    PT Start Time  08-Sep-1106    PT Stop Time  1146    PT Time Calculation (min)  38 min    Activity Tolerance  Patient tolerated treatment well;No increased pain    Behavior During Therapy  WFL for tasks assessed/performed       Past Medical History:  Diagnosis Date  . Antiphospholipid syndrome ([place]) 05/06/2012   Overview:    A. Left MCA stroke, 09/08/98   B. Recurrent MCA stroke while subtherapeutic on warfarin, 2000   C. Positive antiphospholipid antibody testing  . Chicken pox   . Chronic anticoagulation 05/06/2012  . Complex partial seizure ([place]) 04/12/2015  . 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 Kyphosis   . Stroke [facility]) 08-Sep-1998    Past Surgical History:  Procedure Laterality Date  . HERNIA REPAIR Left 09/08/06    There were no vitals filed for this visit.  Subjective Assessment - 04/22/17 1117    Subjective  Pt reports that he made some adjustments with his pillows and things were better. He is still not sure how he is supposed to complete his exercises.     Currently in Pain?  No/denies                      [facility] Adult PT Treatment/Exercise - 04/22/17 0001      Neck Exercises: Machines for Strengthening   UBE (Upper Arm Bike)  2 min forward/backward, L2 with purple ball underneath chin  therapist present to correct technique       Neck Exercises: Supine   Neck Retraction  20 reps;3 secs;Other (comment) decreased cues needed    Other Supine Exercise  horizontal  abduction with head pressed into pillow x20 reps, red TB     Other Supine Exercise  towel squeeze BUE 15x5 sec; scapular retraction x10 reps      Manual Therapy   Soft tissue mobilization  STM upper traps, low cervical/upper thoracic paraspinals     Passive ROM  passive Lt and Rt upper trap stretch 3x30 sec each             PT Education - 04/22/17 1256    Education provided  Yes    Education Details  discussed wear of cervical collar and weening out as he is able; HEP updated     Person(s) Educated  Patient    Methods  Explanation;Verbal cues;Handout;Tactile cues    Comprehension  Verbalized understanding;Returned demonstration       PT Short Term Goals - 04/22/17 1331      PT SHORT TERM GOAL #1   Title  be independent in initial HEP    Time  4    Period  Weeks    Status  On-going      PT SHORT TERM GOAL #2   Title  sit with neutral posture for 3-5 minutes to improve cervical strength and endurance    Time  4  Period  Weeks    Status  On-going      PT SHORT TERM GOAL #3   Title  wean from cervical collar > or = to 25% of the time due to improve cervical strength    Time  4    Period  Weeks    Status  On-going        PT Long Term Goals - 04/13/17 1202      PT LONG TERM GOAL #1   Title  be independent in advanced HEP    Time  8    Period  Weeks    Target Date  06/08/17      PT LONG TERM GOAL #2   Title  reduce FOTO to < or = to 39% limitation    Time  8    Period  Weeks    Status  New    Target Date  06/08/17      PT LONG TERM GOAL #3   Title  wean from cervical brace > or = to 50% of the time due to improved cervical strength    Time  8    Period  Weeks    Status  New    Target Date  06/08/17      PT LONG TERM GOAL #4   Title  demonstrate > or = to 25 degrees of cervical extension to allow for looking overhead    Time  8    Period  Weeks    Status  New    Target Date  06/08/17      PT LONG TERM GOAL #5   Title  demonstrate > or = to 45  degrees of A/ROM cervical sidebending     Time  8    Period  Weeks    Status  New    Target Date  06/08/17      Additional Long Term Goals   Additional Long Term Goals  Yes      PT LONG TERM GOAL #6   Title  sit with neutral cervical posture for > 10 minutes due to improved strength and endurance    Time  8    Period  Weeks    Status  New    Target Date  06/08/17            Plan - 04/22/17 1258    Clinical Impression Statement  Pt continues to demonstrate significant forward head posturing and over activation of the sternocleidomastoid during cervical movements. Focused on continuation of postural strengthening and improved awareness of cervical musculature this session, noting atleast 50% improvement in pt's technique with supine retraction compared to his last session. He has noted improvements in his sleeping with the adjustments made to his pillow and also reported improved use of his UE following last session's manual techniques. He will continue to benefit from skilled PT to address deficits in strength, ROM, and neuromuscular control.     Rehab Potential  Good    PT Frequency  2x / week    PT Treatment/Interventions  ADLs/Self Care Home Management;Cryotherapy;Electrical Stimulation;Ultrasound;Traction;Therapeutic activities;Therapeutic exercise;Neuromuscular re-education;Manual techniques;Passive range of motion;Taping    PT Next Visit Plan  Posture retraining using mirror for cervical posture, try taping for feedback, scapular strength (rows, extension), arm bike with emphasis on neutral head posture;  ball under chin for support  as needed    PT Home Exercise Plan  towel squeeze, cervical retraction into pillow     Consulted and Agree with  Plan of Care  Patient       Patient will benefit from skilled therapeutic intervention in order to improve the following deficits and impairments:  Impaired flexibility, Decreased activity tolerance, Decreased range of motion, Decreased  strength, Postural dysfunction, Improper body mechanics  Visit Diagnosis: Muscle weakness (generalized)  Abnormal posture     Problem List Patient Active Problem List   Diagnosis Date Noted  . Complex partial seizure ([place]) 04/12/2015  . Encounter for therapeutic drug monitoring 06/30/2013  . Stroke ([place]) 06/12/2013  . Arthritis 08/26/2012  . Antiphospholipid syndrome ([place]) 05/06/2012  . 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 Chronic anticoagulation 05/06/2012  . Long term (current) use of anticoagulants 11/14/2010  . TIA 02/25/2009   1:34 PM,04/22/17 Elly Modena PT, DPT [place] [facility] [place] [facility] [address] [place], [place], [address] Phone: [phone]   Fax:  [phone]  Name: [name] MRN: [id number] Date of Birth: 05/17/1940

## 2017-04-27 ENCOUNTER — Ambulatory Visit: Payer: Medicare Other | Admitting: Physical Therapy

## 2017-04-27 DIAGNOSIS — R293 Abnormal posture: Secondary | ICD-10-CM

## 2017-04-27 DIAGNOSIS — M6281 Muscle weakness (generalized): Secondary | ICD-10-CM | POA: Diagnosis not present

## 2017-04-27 NOTE — Therapy (Signed)
[facility] [address], [place], [place], [address] Phone: [phone]   Fax:  [phone]  Physical Therapy Treatment  Patient Details  Name: [name] MRN: [id number] Date of Birth: 05/17/1940 Referring Provider: Robyne Askew, PA   Encounter Date: 04/27/2017  PT End of Session - 04/27/17 1415    Visit Number  5    Number of Visits  10    Date for PT Re-Evaluation  06/08/17    Authorization Type  G-codes at 10    PT Start Time  1410 Pt arrived late     PT Stop Time  9518    PT Time Calculation (min)  32 min    Activity Tolerance  Patient tolerated treatment well;No increased pain    Behavior During Therapy  WFL for tasks assessed/performed       Past Medical History:  Diagnosis Date  . Antiphospholipid syndrome ([place]) 05/06/2012   Overview:    A. Left MCA stroke, Mar 2000   B. Recurrent MCA stroke while subtherapeutic on warfarin, 2000   C. Positive antiphospholipid antibody testing  . Chicken pox   . Chronic anticoagulation 05/06/2012  . Complex partial seizure ([place]) 04/12/2015  . 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 Kyphosis   . Stroke [facility]) 2000    Past Surgical History:  Procedure Laterality Date  . HERNIA REPAIR Left 2008    There were no vitals filed for this visit.  Subjective Assessment - 04/27/17 1413    Subjective  Pt arrives reporting pain in his Rt hamstring. He continues to complete his HEP as instructed.     Currently in Pain?  Yes    Pain Score  7     Pain Location  -- Rt lateral hamstring     Pain Orientation  Right    Pain Descriptors / Indicators  Aching    Pain Type  Acute pain    Pain Radiating Towards  none     Pain Onset  Today    Pain Frequency  Intermittent    Aggravating Factors   weight bearing     Pain Relieving Factors  laying down     Effect of Pain on Daily Activities  mild                       [facility] Adult PT Treatment/Exercise -  04/27/17 0001      Neck Exercises: Supine   Neck Retraction  15 reps;3 secs;Limitations    Neck Retraction Limitations  2nd set inclined 15x3 sec hold     Capital Flexion  20 reps;Limitations    Capital Flexion Limitations  x2 sets, using small basketball and tactile and verbal cues provided    Lateral Flexion  Both;20 reps;Limitations    Lateral Flexion Limitations  therapist AAROM to improve muscle activation and control       Manual Therapy   Soft tissue mobilization  STM Lt/Rt scalenes, SCM, upper trap     Passive ROM  Active assisted lateral flexion              PT Education - 04/27/17 1442    Education provided  Yes    Education Details  encouraged pt to continue use of cervical collar and every 1-2 hours take it off to allow for HEP and focus on maintaining upright posture.     Person(s) Educated  Patient    Methods  Explanation    Comprehension  Verbalized understanding       PT Short Term Goals - 04/22/17 1331      PT SHORT TERM GOAL #1   Title  be independent in initial HEP    Time  4    Period  Weeks    Status  On-going      PT SHORT TERM GOAL #2   Title  sit with neutral posture for 3-5 minutes to improve cervical strength and endurance    Time  4    Period  Weeks    Status  On-going      PT SHORT TERM GOAL #3   Title  wean from cervical collar > or = to 25% of the time due to improve cervical strength    Time  4    Period  Weeks    Status  On-going        PT Long Term Goals - 04/13/17 1202      PT LONG TERM GOAL #1   Title  be independent in advanced HEP    Time  8    Period  Weeks    Target Date  06/08/17      PT LONG TERM GOAL #2   Title  reduce FOTO to < or = to 39% limitation    Time  8    Period  Weeks    Status  New    Target Date  06/08/17      PT LONG TERM GOAL #3   Title  wean from cervical brace > or = to 50% of the time due to improved cervical strength    Time  8    Period  Weeks    Status  New    Target Date  06/08/17       PT LONG TERM GOAL #4   Title  demonstrate > or = to 25 degrees of cervical extension to allow for looking overhead    Time  8    Period  Weeks    Status  New    Target Date  06/08/17      PT LONG TERM GOAL #5   Title  demonstrate > or = to 45 degrees of A/ROM cervical sidebending     Time  8    Period  Weeks    Status  New    Target Date  06/08/17      Additional Long Term Goals   Additional Long Term Goals  Yes      PT LONG TERM GOAL #6   Title  sit with neutral cervical posture for > 10 minutes due to improved strength and endurance    Time  8    Period  Weeks    Status  New    Target Date  06/08/17            Plan - 04/27/17 1443    Clinical Impression Statement  Pt continues to complete his HEP and arrived today reporting decreased use of his brace. Because the pt still demonstrates poor cervical muscle endurance, therapist encouraged him to utilize the brace with more frequency breaks throughout the day to allow for him to work on his HEP and posture awareness. Noting improved ability activate deep cervical flexors for the first time this session, with heavy cuing. Due to muscle fatigue, several sets were performed of no more than 10 reps to allow for recovery in between and improve quality of the exercise. Ended with pt reporting no pain or  change in symptoms. Will continue with current POC.      Rehab Potential  Good    PT Frequency  2x / week    PT Treatment/Interventions  ADLs/Self Care Home Management;Cryotherapy;Electrical Stimulation;Ultrasound;Traction;Therapeutic activities;Therapeutic exercise;Neuromuscular re-education;Manual techniques;Passive range of motion;Taping    PT Home Exercise Plan  towel squeeze, cervical retraction into pillow     Consulted and Agree with Plan of Care  Patient       Patient will benefit from skilled therapeutic intervention in order to improve the following deficits and impairments:  Impaired flexibility, Decreased activity  tolerance, Decreased range of motion, Decreased strength, Postural dysfunction, Improper body mechanics  Visit Diagnosis: Muscle weakness (generalized)  Abnormal posture     Problem List Patient Active Problem List   Diagnosis Date Noted  . Complex partial seizure ([place]) 04/12/2015  . Encounter for therapeutic drug monitoring 06/30/2013  . Stroke ([place]) 06/12/2013  . Arthritis 08/26/2012  . Antiphospholipid syndrome ([place]) 05/06/2012  . 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 Chronic anticoagulation 05/06/2012  . Long term (current) use of anticoagulants 11/14/2010  . TIA 02/25/2009    2:49 PM,04/27/17 Elly Modena PT, DPT [place] [facility] [place] [facility] [address] [place], [place], [address] Phone: [phone]   Fax:  [phone]  Name: DEAGEN KRASS MRN: [id number] Date of Birth: 05/17/1940

## 2017-05-04 ENCOUNTER — Ambulatory Visit: Payer: Medicare Other | Admitting: Physical Therapy

## 2017-05-05 ENCOUNTER — Ambulatory Visit: Payer: Medicare Other | Admitting: Physical Therapy

## 2017-05-05 DIAGNOSIS — R293 Abnormal posture: Secondary | ICD-10-CM | POA: Diagnosis not present

## 2017-05-05 DIAGNOSIS — M6281 Muscle weakness (generalized): Secondary | ICD-10-CM | POA: Diagnosis not present

## 2017-05-05 NOTE — Therapy (Signed)
[facility] [address], [place], [place], [address] Phone: [phone]   Fax:  [phone]  Physical Therapy Treatment  Patient Details  Name: [name] MRN: [id number] Date of Birth: 1940-12-18 Referring Provider: Robyne Askew, PA   Encounter Date: 05/05/2017  PT End of Session - 05/05/17 1458    Visit Number  6    Number of Visits  10    Date for PT Re-Evaluation  06/08/17    Authorization Type  G-codes at 10    PT Start Time  2563    PT Stop Time  8937    PT Time Calculation (min)  38 min    Activity Tolerance  Patient tolerated treatment well;No increased pain    Behavior During Therapy  WFL for tasks assessed/performed       Past Medical History:  Diagnosis Date  . Antiphospholipid syndrome ([place]) 05/06/2012   Overview:    A. Left MCA stroke, Mar 2000   B. Recurrent MCA stroke while subtherapeutic on warfarin, 2000   C. Positive antiphospholipid antibody testing  . Chicken pox   . Chronic anticoagulation 05/06/2012  . Complex partial seizure ([place]) 04/12/2015  . 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 Kyphosis   . Stroke [facility]) 2000    Past Surgical History:  Procedure Laterality Date  . HERNIA REPAIR Left 2008    There were no vitals filed for this visit.  Subjective Assessment - 05/05/17 1455    Subjective  Pt arrives reporting that things are going well. He feels that he is about 45% improved in his strength and flexibility.     Currently in Pain?  No/denies    Pain Onset  Today         [facility] [place] PT Assessment - 05/05/17 0001      Strength   Overall Strength Comments  wrist flexion/extension 5/5 MMT                  [facility] Adult PT Treatment/Exercise - 05/05/17 0001      Therapeutic Activites    ADL's  stacking cones on top shelf, therapist providing verbal cues to encourage head upright       Exercises   Exercises  Other Exercises    Other Exercises   Rt grip  strengthening with 3.0 lb digi-flex 2x5 reps (10 sec hold)       Neck Exercises: Seated   Other Seated Exercise  capital flexion hold into small ball x3 sec, 15 reps  incline      Neck Exercises: Supine   Other Supine Exercise  scap squeeze x15 reps       Manual Therapy   Passive ROM  Active assisted cervical rotation Lt/Rt x10 reps each; thoracic rotation with therapist over pressure  x10 reps each            PT Education - 05/05/17 1546    Education provided  Yes    Education Details  technique with therex; log roll technique    Person(s) Educated  Patient    Methods  Explanation;Verbal cues;Tactile cues    Comprehension  Verbalized understanding;Need further instruction;Returned demonstration       PT Short Term Goals - 05/05/17 1551      PT SHORT TERM GOAL #1   Title  be independent in initial HEP    Time  4    Period  Weeks    Status  Achieved      PT  SHORT TERM GOAL #2   Title  sit with neutral posture for 3-5 minutes to improve cervical strength and endurance    Baseline  ~1 min    Time  4    Period  Weeks    Status  Partially Met      PT SHORT TERM GOAL #3   Title  wean from cervical collar > or = to 25% of the time due to improve cervical strength    Time  4    Period  Weeks    Status  Achieved        PT Long Term Goals - 04/13/17 1202      PT LONG TERM GOAL #1   Title  be independent in advanced HEP    Time  8    Period  Weeks    Target Date  06/08/17      PT LONG TERM GOAL #2   Title  reduce FOTO to < or = to 39% limitation    Time  8    Period  Weeks    Status  New    Target Date  06/08/17      PT LONG TERM GOAL #3   Title  wean from cervical brace > or = to 50% of the time due to improved cervical strength    Time  8    Period  Weeks    Status  New    Target Date  06/08/17      PT LONG TERM GOAL #4   Title  demonstrate > or = to 25 degrees of cervical extension to allow for looking overhead    Time  8    Period  Weeks     Status  New    Target Date  06/08/17      PT LONG TERM GOAL #5   Title  demonstrate > or = to 45 degrees of A/ROM cervical sidebending     Time  8    Period  Weeks    Status  New    Target Date  06/08/17      Additional Long Term Goals   Additional Long Term Goals  Yes      PT LONG TERM GOAL #6   Title  sit with neutral cervical posture for > 10 minutes due to improved strength and endurance    Time  8    Period  Weeks    Status  New    Target Date  06/08/17            Plan - 05/05/17 1550    Clinical Impression Statement  Pt continues to make progress towards his goals, reporting 45% improvement overall since beginning PT. He does demonstrate difficulties with carryover of certain motor memory activities involving the deep cervical flexors, however he was able to pick up on the exercises much faster than he did last session despite this lack of carry over. Therapist did note improvements in cervical rotation while maintianing cervical extension, with decreased cues needed. He was able to maintain his head upright for atleast 60 sec this session without cues or noted fatigue. Will continue with current POC.      Rehab Potential  Good    PT Frequency  2x / week    PT Treatment/Interventions  ADLs/Self Care Home Management;Cryotherapy;Electrical Stimulation;Ultrasound;Traction;Therapeutic activities;Therapeutic exercise;Neuromuscular re-education;Manual techniques;Passive range of motion;Taping    PT Next Visit Plan  Posture retraining using mirror for cervical posture, cervical ROM/control, try taping for  feedback, scapular strength (rows, extension), arm bike with emphasis on neutral head posture;  ball under chin for support  as needed    PT Home Exercise Plan  towel squeeze, cervical retraction into pillow     Consulted and Agree with Plan of Care  Patient       Patient will benefit from skilled therapeutic intervention in order to improve the following deficits and  impairments:  Impaired flexibility, Decreased activity tolerance, Decreased range of motion, Decreased strength, Postural dysfunction, Improper body mechanics  Visit Diagnosis: Muscle weakness (generalized)  Abnormal posture     Problem List Patient Active Problem List   Diagnosis Date Noted  . Complex partial seizure ([place]) 04/12/2015  . Encounter for therapeutic drug monitoring 06/30/2013  . Stroke ([place]) 06/12/2013  . Arthritis 08/26/2012  . Antiphospholipid syndrome ([place]) 05/06/2012  . 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 Chronic anticoagulation 05/06/2012  . Long term (current) use of anticoagulants 11/14/2010  . TIA 02/25/2009    3:53 PM,05/05/17 Elly Modena PT, DPT [place] [facility] [place] [facility] [address] [place], [place], [address] Phone: [phone]   Fax:  [phone]  Name: [name] MRN: [id number] Date of Birth: 29-Jul-1940

## 2017-05-06 ENCOUNTER — Encounter: Payer: Medicare Other | Admitting: Physical Therapy

## 2017-05-10 ENCOUNTER — Ambulatory Visit (INDEPENDENT_AMBULATORY_CARE_PROVIDER_SITE_OTHER): Payer: Medicare Other | Admitting: General Practice

## 2017-05-10 ENCOUNTER — Ambulatory Visit: Payer: Medicare Other

## 2017-05-10 DIAGNOSIS — Z7901 Long term (current) use of anticoagulants: Secondary | ICD-10-CM | POA: Diagnosis not present

## 2017-05-10 LAB — POCT INR: INR: 4.9

## 2017-05-10 NOTE — Patient Instructions (Addendum)
Pre visit review using our clinic review tool, if applicable. No additional management support is needed unless otherwise documented below in the visit note.  Skip dose today (12/3) and tomorrow (12/4) and then continue to take 7.5 mg all days except 5 mg on Monday and Fridays.  Re-check in 2 weeks.

## 2017-05-10 NOTE — Progress Notes (Signed)
I agree with this plan.

## 2017-05-11 ENCOUNTER — Ambulatory Visit: Payer: Medicare Other | Attending: Physician Assistant | Admitting: Physical Therapy

## 2017-05-11 DIAGNOSIS — M6281 Muscle weakness (generalized): Secondary | ICD-10-CM | POA: Insufficient documentation

## 2017-05-11 DIAGNOSIS — R293 Abnormal posture: Secondary | ICD-10-CM | POA: Diagnosis not present

## 2017-05-11 NOTE — Therapy (Signed)
[facility] [address], [place], [place], [address] Phone: [phone]   Fax:  [phone]  Physical Therapy Treatment  Patient Details  Name: [name] MRN: [id number] Date of Birth: 03-24-1941 Referring Provider: Robyne Askew, PA   Encounter Date: 05/11/2017  PT End of Session - 05/11/17 1421    Visit Number  7    Number of Visits  10    Date for PT Re-Evaluation  06/08/17    Authorization Type  G-codes at 10    PT Start Time  1856 pt late    PT Stop Time  1445    PT Time Calculation (min)  34 min    Activity Tolerance  Patient tolerated treatment well       Past Medical History:  Diagnosis Date  . Antiphospholipid syndrome ([place]) 05/06/2012   Overview:    A. Left MCA stroke, Mar 2000   B. Recurrent MCA stroke while subtherapeutic on warfarin, 2000   C. Positive antiphospholipid antibody testing  . Chicken pox   . Chronic anticoagulation 05/06/2012  . Complex partial seizure ([place]) 04/12/2015  . 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 Kyphosis   . Stroke [facility]) 2000    Past Surgical History:  Procedure Laterality Date  . HERNIA REPAIR Left 2008    There were no vitals filed for this visit.  Subjective Assessment - 05/11/17 1414    Subjective  Denies pain.  States he takes his brace off some during the day.  Feels he is getting stronger.    Currently in Pain?  No/denies    Pain Score  0-No pain                      [facility] Adult PT Treatment/Exercise - 05/11/17 0001      Neuro Re-ed    Neuro Re-ed Details   cervical extensor muscle activation      Neck Exercises: Seated   Neck Retraction  10 reps push into beach ball against wall    Upper Extremity D1  10 reps concurrent with ES red plyo ball chops    Other Seated Exercise  capital flex nods 10x    Other Seated Exercise  UE movements with mirror feedback      Neck Exercises: Supine   Neck Retraction  15 reps;3  secs;Limitations incline wedge     Cervical Rotation  Right;Left;5 reps on incline wedge     Other Supine Exercise  on incline red band scapular series: overhead, horizontal abduction, diagonals, external rotation 10x each cues to keep head in neutral      Electrical Stimulation   Electrical Stimulation Location  bil cervical muscles     Electrical Stimulation Action  Russian partly concurrent with ex    Electrical Stimulation Parameters  32 ma 10on/10 off 11 min    Electrical Stimulation Goals  Neuromuscular facilitation               PT Short Term Goals - 05/11/17 1444      PT SHORT TERM GOAL #1   Title  be independent in initial HEP    Status  Achieved      PT SHORT TERM GOAL #2   Title  sit with neutral posture for 3-5 minutes to improve cervical strength and endurance    Status  Achieved      PT SHORT TERM GOAL #3   Title  wean from cervical collar > or =  to 25% of the time due to improve cervical strength    Status  Achieved        PT Long Term Goals - 04/13/17 1202      PT LONG TERM GOAL #1   Title  be independent in advanced HEP    Time  8    Period  Weeks    Target Date  06/08/17      PT LONG TERM GOAL #2   Title  reduce FOTO to < or = to 39% limitation    Time  8    Period  Weeks    Status  New    Target Date  06/08/17      PT LONG TERM GOAL #3   Title  wean from cervical brace > or = to 50% of the time due to improved cervical strength    Time  8    Period  Weeks    Status  New    Target Date  06/08/17      PT LONG TERM GOAL #4   Title  demonstrate > or = to 25 degrees of cervical extension to allow for looking overhead    Time  8    Period  Weeks    Status  New    Target Date  06/08/17      PT LONG TERM GOAL #5   Title  demonstrate > or = to 45 degrees of A/ROM cervical sidebending     Time  8    Period  Weeks    Status  New    Target Date  06/08/17      Additional Long Term Goals   Additional Long Term Goals  Yes      PT LONG  TERM GOAL #6   Title  sit with neutral cervical posture for > 10 minutes due to improved strength and endurance    Time  8    Period  Weeks    Status  New    Target Date  06/08/17            Plan - 05/11/17 1421    Clinical Impression Statement  Patient needs cues often to maintain head in a neutral position despite visual (mirror) feedback.  Initiated trial of NMES to cervical extensors partly concurrent with ex.  Patient encouraged to rest for 10 sec between exercises secondary to muscular fatigue although he persists in continuing without breaks.  Denies pain but patient noted to rub his fingers together in response to "tingling."  Patient often closes his eyes while exercising.  Full shoulder ROM.   All STGs met.     Rehab Potential  Good    PT Frequency  2x / week    PT Duration  8 weeks    PT Treatment/Interventions  ADLs/Self Care Home Management;Cryotherapy;Electrical Stimulation;Ultrasound;Traction;Therapeutic activities;Therapeutic exercise;Neuromuscular re-education;Manual techniques;Passive range of motion;Taping    PT Next Visit Plan  assess responset to NMES for neuromuscular retraining;  recheck cervical rotation ROM next visit;   Posture retraining using mirror for cervical posture, cervical ROM/control, try taping for feedback, scapular strength (rows, extension), arm bike with emphasis on neutral head posture;  ball under chin for support  as needed       Patient will benefit from skilled therapeutic intervention in order to improve the following deficits and impairments:  Impaired flexibility, Decreased activity tolerance, Decreased range of motion, Decreased strength, Postural dysfunction, Improper body mechanics  Visit Diagnosis: Muscle weakness (generalized)  Abnormal posture  Problem List Patient Active Problem List   Diagnosis Date Noted  . Complex partial seizure ([place]) 04/12/2015  . Encounter for therapeutic drug monitoring 06/30/2013  . Stroke ([place])  06/12/2013  . Arthritis 08/26/2012  . Antiphospholipid syndrome ([place]) 05/06/2012  . 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 Chronic anticoagulation 05/06/2012  . Long term (current) use of anticoagulants 11/14/2010  . TIA 02/25/2009   Ruben Im, PT 05/11/17 3:15 PM Phone: [phone] Fax: [phone]  Alvera Singh 05/11/2017, 3:12 PM  [facility] [address] [place], [place], [address] Phone: [phone]   Fax:  [phone]  Name: [name] MRN: [id number] Date of Birth: 1940-07-25

## 2017-05-13 ENCOUNTER — Ambulatory Visit: Payer: Medicare Other | Admitting: Physical Therapy

## 2017-05-13 DIAGNOSIS — M6281 Muscle weakness (generalized): Secondary | ICD-10-CM

## 2017-05-13 DIAGNOSIS — R293 Abnormal posture: Secondary | ICD-10-CM

## 2017-05-13 NOTE — Therapy (Signed)
[facility] [address] [place], [place], [address] Phone: [phone]   Fax:  [phone]  Physical Therapy Treatment  Patient Details  Name: [name] MRN: [id number] Date of Birth: [birth year] Referring Provider: Robyne Askew, PA   Encounter Date: 05/13/2017  PT End of Session - 05/13/17 1426    Visit Number  8    Number of Visits  10    Date for PT Re-Evaluation  06/08/17    Authorization Type  G-codes at 10    PT Start Time  5093 pt late    PT Stop Time  1449    PT Time Calculation (min)  38 min    Activity Tolerance  Patient tolerated treatment well       Past Medical History:  Diagnosis Date  . Antiphospholipid syndrome ([place]) 05/06/2012   Overview:    A. Left MCA stroke, Mar 2000   B. Recurrent MCA stroke while subtherapeutic on warfarin, 2000   C. Positive antiphospholipid antibody testing  . Chicken pox   . Chronic anticoagulation 05/06/2012  . Complex partial seizure ([place]) 04/12/2015  . 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 Kyphosis   . Stroke [facility]) 2000    Past Surgical History:  Procedure Laterality Date  . HERNIA REPAIR Left 2008    There were no vitals filed for this visit.  Subjective Assessment - 05/13/17 1413    Subjective  Patient states the NMES with exercise "was very, very helpful."  Presents without the brace today.      Currently in Pain?  No/denies    Pain Score  0-No pain         [facility] PT Assessment - 05/13/17 0001      AROM   Overall AROM Comments  UE A/ROM is full without pain    Cervical Extension  35    Cervical - Right Side Bend  20    Cervical - Left Side Bend  25    Cervical - Right Rotation  55 with chin neutral    Cervical - Left Rotation  55 with chin neutral                  [facility] Adult PT Treatment/Exercise - 05/13/17 0001      Neck Exercises: Standing   Neck Retraction  10 reps with pillow b/w head and wall    Other Standing  Exercises  UE and LE movements with head against wall concurrent with NMES and mirror feedback    Other Standing Exercises  cervical rotation with head against pillow 10x      Neck Exercises: Seated   Neck Retraction  10 reps push into beach ball against wall    Cervical Rotation  Right;Left;10 reps      Neck Exercises: Supine   Neck Retraction  15 reps;3 secs;Limitations incline wedge     Cervical Rotation  Right;Left;5 reps on incline wedge     Other Supine Exercise  on incline red band scapular series: overhead, horizontal abduction, diagonals, external rotation 10x each cues to keep head in neutral      Electrical Stimulation   Electrical Stimulation Location  bil cervical muscles     Electrical Stimulation Action  Manufacturing systems engineer Parameters  32 ma 5 on /5 off    Electrical Stimulation Goals  Neuromuscular facilitation               PT Short Term Goals -  05/11/17 1444      PT SHORT TERM GOAL #1   Title  be independent in initial HEP    Status  Achieved      PT SHORT TERM GOAL #2   Title  sit with neutral posture for 3-5 minutes to improve cervical strength and endurance    Status  Achieved      PT SHORT TERM GOAL #3   Title  wean from cervical collar > or = to 25% of the time due to improve cervical strength    Status  Achieved        PT Long Term Goals - 04/13/17 1202      PT LONG TERM GOAL #1   Title  be independent in advanced HEP    Time  8    Period  Weeks    Target Date  06/08/17      PT LONG TERM GOAL #2   Title  reduce FOTO to < or = to 39% limitation    Time  8    Period  Weeks    Status  New    Target Date  06/08/17      PT LONG TERM GOAL #3   Title  wean from cervical brace > or = to 50% of the time due to improved cervical strength    Time  8    Period  Weeks    Status  New    Target Date  06/08/17      PT LONG TERM GOAL #4   Title  demonstrate > or = to 25 degrees of cervical extension to allow for looking  overhead    Time  8    Period  Weeks    Status  New    Target Date  06/08/17      PT LONG TERM GOAL #5   Title  demonstrate > or = to 45 degrees of A/ROM cervical sidebending     Time  8    Period  Weeks    Status  New    Target Date  06/08/17      Additional Long Term Goals   Additional Long Term Goals  Yes      PT LONG TERM GOAL #6   Title  sit with neutral cervical posture for > 10 minutes due to improved strength and endurance    Time  8    Period  Weeks    Status  New    Target Date  06/08/17            Plan - 05/13/17 1438    Clinical Impression Statement  The patient needs fewer cues to maintain head in a neutral position in supine, sitting and with progression to standing today.  Exercises in upright position done concurrently with NMES.  No pain or tingling symptoms today.  Stiffness noted with cervical rotation.  Good improvement with sidebending and extension ROM.      Rehab Potential  Good    PT Frequency  2x / week    PT Duration  8 weeks    PT Treatment/Interventions  ADLs/Self Care Home Management;Cryotherapy;Electrical Stimulation;Ultrasound;Traction;Therapeutic activities;Therapeutic exercise;Neuromuscular re-education;Manual techniques;Passive range of motion;Taping    PT Next Visit Plan  continue NMES for neuromuscular retraining;  recheck cervical rotation ROM next visit;   Posture retraining using mirror for cervical posture,  scapular strength (rows, extension), arm bike with emphasis on neutral head posture;  ball under chin for support  as needed  Patient will benefit from skilled therapeutic intervention in order to improve the following deficits and impairments:  Impaired flexibility, Decreased activity tolerance, Decreased range of motion, Decreased strength, Postural dysfunction, Improper body mechanics  Visit Diagnosis: Muscle weakness (generalized)  Abnormal posture     Problem List Patient Active Problem List   Diagnosis Date  Noted  . Complex partial seizure ([place]) 04/12/2015  . Encounter for therapeutic drug monitoring 06/30/2013  . Stroke ([place]) 06/12/2013  . Arthritis 08/26/2012  . Antiphospholipid syndrome ([place]) 05/06/2012  . 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 Chronic anticoagulation 05/06/2012  . Long term (current) use of anticoagulants 11/14/2010  . TIA 02/25/2009   Ruben Im, PT 05/13/17 5:09 PM Phone: [phone] Fax: [phone]  Alvera Singh 05/13/2017, 5:08 PM  [facility] [address] [place], [place], [address] Phone: [phone]   Fax:  [phone]  Name: [name] MRN: [id number] Date of Birth: Dec 01, 1940

## 2017-05-18 ENCOUNTER — Ambulatory Visit: Payer: Medicare Other | Admitting: Adult Health

## 2017-05-18 ENCOUNTER — Encounter: Payer: Medicare Other | Admitting: Physical Therapy

## 2017-05-20 ENCOUNTER — Ambulatory Visit: Payer: Medicare Other | Admitting: Physical Therapy

## 2017-05-24 ENCOUNTER — Ambulatory Visit (INDEPENDENT_AMBULATORY_CARE_PROVIDER_SITE_OTHER): Payer: Medicare Other | Admitting: General Practice

## 2017-05-24 DIAGNOSIS — Z7901 Long term (current) use of anticoagulants: Secondary | ICD-10-CM | POA: Diagnosis not present

## 2017-05-24 LAB — POCT INR: INR: 4

## 2017-05-24 NOTE — Progress Notes (Signed)
I agree with this plan.

## 2017-05-24 NOTE — Patient Instructions (Addendum)
Pre visit review using our clinic review tool, if applicable. No additional management support is needed unless otherwise documented below in the visit note.  Skip dose today (12/17) and then change dosage and take 7.5 mg all days except 5 mg on Monday/Wednesday/ and Fridays.  Re-check in 2 weeks.

## 2017-05-25 ENCOUNTER — Ambulatory Visit: Payer: Medicare Other | Admitting: Adult Health

## 2017-05-25 ENCOUNTER — Ambulatory Visit: Payer: Medicare Other | Admitting: Physical Therapy

## 2017-05-25 DIAGNOSIS — M6281 Muscle weakness (generalized): Secondary | ICD-10-CM

## 2017-05-25 DIAGNOSIS — R293 Abnormal posture: Secondary | ICD-10-CM | POA: Diagnosis not present

## 2017-05-25 NOTE — Therapy (Addendum)
[facility] [address] [place], [place], [address] Phone: [phone]   Fax:  [phone]  Physical Therapy Treatment/Discharge  Patient Details  Name: [name] MRN: [id number] Date of Birth: Jun 24, 1940 Referring Provider: Robyne Askew, PA   Encounter Date: 05/25/2017  PT End of Session - 05/25/17 1443    Visit Number  9    Number of Visits  10    Date for PT Re-Evaluation  06/08/17    Authorization Type  G-codes at 10    PT Start Time  1402    PT Stop Time  1443    PT Time Calculation (min)  41 min    Activity Tolerance  Patient tolerated treatment well;No increased pain    Behavior During Therapy  WFL for tasks assessed/performed       Past Medical History:  Diagnosis Date  . Antiphospholipid syndrome ([place]) 05/06/2012   Overview:    A. Left MCA stroke, Mar 2000   B. Recurrent MCA stroke while subtherapeutic on warfarin, 2000   C. Positive antiphospholipid antibody testing  . Chicken pox   . Chronic anticoagulation 05/06/2012  . Complex partial seizure ([place]) 04/12/2015  . 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 Kyphosis   . Stroke [facility]) 2000    Past Surgical History:  Procedure Laterality Date  . HERNIA REPAIR Left 2008    There were no vitals filed for this visit.  Subjective Assessment - 05/25/17 1404    Subjective  Pt reports that things continue to go well. He is wearing his brace 1 hour on and off throughout the day. No pain currently, but he does have some numbness still going down the Rt arm occasionally.     Currently in Pain?  No/denies                      [facility] Adult PT Treatment/Exercise - 05/25/17 0001      Neck Exercises: Seated   Neck Retraction  15 reps;Other (comment) with ball under chin, with NMES    Other Seated Exercise  BUE flexion with cane, Pt maintaining cervical extension hold 5sec, x50mn   NMES during    Other Seated Exercise  rows with green  TB x15 reps (with NMES)       Neck Exercises: Supine   Capital Flexion  20 reps;3 secs    Capital Flexion Limitations  on wedge, ball removed, verbal cues only     Other Supine Exercise  B horizontal abduction x15 reps, D2 flexion x15 reps each, on wedge incline       EAcupuncturistStimulation Location  B cervical paraspinals/upper trap    EChartered certified accountant Russian, concurrent with therex to improve muscle activation and posture     Electrical Stimulation Parameters  34-340m 5 on/off, x15 min    Electrical Stimulation Goals  Neuromuscular facilitation             PT Education - 05/25/17 1442    Education provided  Yes    Education Details  technique with therex    Person(s) Educated  Patient    Methods  Explanation    Comprehension  Verbalized understanding       PT Short Term Goals - 05/25/17 1418      PT SHORT TERM GOAL #1   Title  be independent in initial HEP    Status  Achieved      PT SHORT  TERM GOAL #2   Title  sit with neutral posture for 3-5 minutes to improve cervical strength and endurance    Status  Achieved      PT SHORT TERM GOAL #3   Title  wean from cervical collar > or = to 25% of the time due to improve cervical strength    Status  Achieved        PT Long Term Goals - 05/25/17 1418      PT LONG TERM GOAL #1   Title  be independent in advanced HEP    Time  8    Period  Weeks    Status  On-going      PT LONG TERM GOAL #2   Title  reduce FOTO to < or = to 39% limitation    Time  8    Period  Weeks    Status  On-going      PT LONG TERM GOAL #3   Title  wean from cervical brace > or = to 50% of the time due to improved cervical strength    Baseline  1 hour on, 1 hour off    Time  8    Period  Weeks    Status  Partially Met      PT LONG TERM GOAL #4   Title  demonstrate > or = to 25 degrees of cervical extension to allow for looking overhead    Time  8    Period  Weeks    Status  On-going      PT  LONG TERM GOAL #5   Title  demonstrate > or = to 45 degrees of A/ROM cervical sidebending     Time  8    Period  Weeks    Status  On-going      PT LONG TERM GOAL #6   Title  sit with neutral cervical posture for > 10 minutes due to improved strength and endurance    Time  8    Period  Weeks    Status  On-going            Plan - 05/25/17 1417    Clinical Impression Statement  Pt continues to make steady progress towards his goals, reporting 80% improvement in his cervical strength and ROM since the start of therapy. Session continued with focus on deep cervical flexor and paraspinal muscle strengthening, noting pt was able to complete chin tucks with 50% less cuing than he has in previous sessions. Pt reported no change in symptoms or increase in pain with today's exercises. Will continue with current POC, progressing to more upright positions and functional activity as able.     Rehab Potential  Good    PT Frequency  2x / week    PT Duration  8 weeks    PT Treatment/Interventions  ADLs/Self Care Home Management;Cryotherapy;Electrical Stimulation;Ultrasound;Traction;Therapeutic activities;Therapeutic exercise;Neuromuscular re-education;Manual techniques;Passive range of motion;Taping    PT Next Visit Plan  continue NMES for neuromuscular retraining in more complex positions; Posture retraining using mirror for cervical posture as needed,  scapular strength (rows, extension), arm bike with emphasis on neutral head posture;  ball under chin for support  as needed    PT Home Exercise Plan  towel squeeze, cervical retraction into pillow        Patient will benefit from skilled therapeutic intervention in order to improve the following deficits and impairments:  Impaired flexibility, Decreased activity tolerance, Decreased range of motion, Decreased strength, Postural dysfunction,  Improper body mechanics  Visit Diagnosis: Muscle weakness (generalized)  Abnormal posture     Problem  List Patient Active Problem List   Diagnosis Date Noted  . Complex partial seizure ([place]) 04/12/2015  . Encounter for therapeutic drug monitoring 06/30/2013  . Stroke ([place]) 06/12/2013  . Arthritis 08/26/2012  . Antiphospholipid syndrome ([place]) 05/06/2012  . 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 Chronic anticoagulation 05/06/2012  . Long term (current) use of anticoagulants 11/14/2010  . TIA 02/25/2009   2:44 PM,05/25/17 Elly Modena PT, DPT [place] [facility] [address], [place], [place], [address] Phone: [phone]   Fax:  [phone]  Name: [name] MRN: [id number] Date of Birth: April 23, 1941  *Addendum to resolve episode of care and d/c pt from PT.   [place]  Visits from Start of Care: 9  Current functional level related to goals / functional outcomes: See above for more details    Remaining deficits: See above for more details    Education / Equipment: See above for more details   Plan: Patient agrees to discharge.  Patient goals were partially met. Patient is being discharged due to not returning since the last visit.  ?????    Attempted to contact pt regarding scheduling, but he has not returned our calls.    8:52 AM,07/12/17 [place], [place] [facility] [place]

## 2017-05-26 ENCOUNTER — Ambulatory Visit: Payer: Medicare Other | Admitting: Adult Health

## 2017-05-27 ENCOUNTER — Ambulatory Visit: Payer: Medicare Other | Admitting: Adult Health

## 2017-05-27 ENCOUNTER — Encounter: Payer: Medicare Other | Admitting: Physical Therapy

## 2017-06-03 ENCOUNTER — Ambulatory Visit: Payer: Medicare Other | Admitting: Physical Therapy

## 2017-06-04 ENCOUNTER — Ambulatory Visit: Payer: Medicare Other | Admitting: Physical Therapy

## 2017-06-07 ENCOUNTER — Ambulatory Visit (INDEPENDENT_AMBULATORY_CARE_PROVIDER_SITE_OTHER): Payer: Medicare Other | Admitting: General Practice

## 2017-06-07 DIAGNOSIS — Z7901 Long term (current) use of anticoagulants: Secondary | ICD-10-CM

## 2017-06-07 DIAGNOSIS — I639 Cerebral infarction, unspecified: Secondary | ICD-10-CM

## 2017-06-07 LAB — POCT INR: INR: 2.1

## 2017-06-07 NOTE — Patient Instructions (Addendum)
Pre visit review using our clinic review tool, if applicable. No additional management support is needed unless otherwise documented below in the visit note.  ake 7.5 mg today (12/31) and then change dosage and take 7.5 mg all days except 5 mg on Monday/Fridays.  Re-check in 3 weeks.

## 2017-06-15 ENCOUNTER — Other Ambulatory Visit: Payer: Self-pay

## 2017-06-15 ENCOUNTER — Emergency Department (HOSPITAL_COMMUNITY): Payer: Medicare Other

## 2017-06-15 ENCOUNTER — Inpatient Hospital Stay (HOSPITAL_COMMUNITY)
Admission: EM | Admit: 2017-06-15 | Discharge: 2017-06-18 | DRG: 071 | Disposition: A | Discharge status: Home Health | Payer: Medicare Other | Attending: Family Medicine | Admitting: Family Medicine

## 2017-06-15 ENCOUNTER — Encounter (HOSPITAL_COMMUNITY): Payer: Self-pay | Admitting: Emergency Medicine

## 2017-06-15 DIAGNOSIS — R946 Abnormal results of thyroid function studies: Secondary | ICD-10-CM | POA: Diagnosis present

## 2017-06-15 DIAGNOSIS — G40209 Localization-related (focal) (partial) symptomatic epilepsy and epileptic syndromes with complex partial seizures, not intractable, without status epilepticus: Secondary | ICD-10-CM | POA: Diagnosis present

## 2017-06-15 DIAGNOSIS — I639 Cerebral infarction, unspecified: Secondary | ICD-10-CM | POA: Diagnosis not present

## 2017-06-15 DIAGNOSIS — Z9181 History of falling: Secondary | ICD-10-CM

## 2017-06-15 DIAGNOSIS — R4182 Altered mental status, unspecified: Secondary | ICD-10-CM

## 2017-06-15 DIAGNOSIS — R569 Unspecified convulsions: Secondary | ICD-10-CM | POA: Diagnosis present

## 2017-06-15 DIAGNOSIS — I517 Cardiomegaly: Secondary | ICD-10-CM | POA: Diagnosis not present

## 2017-06-15 DIAGNOSIS — M40202 Unspecified kyphosis, cervical region: Secondary | ICD-10-CM | POA: Diagnosis not present

## 2017-06-15 DIAGNOSIS — I69398 Other sequelae of cerebral infarction: Secondary | ICD-10-CM

## 2017-06-15 DIAGNOSIS — M4802 Spinal stenosis, cervical region: Secondary | ICD-10-CM | POA: Diagnosis not present

## 2017-06-15 DIAGNOSIS — G9340 Encephalopathy, unspecified: Secondary | ICD-10-CM | POA: Diagnosis not present

## 2017-06-15 DIAGNOSIS — J9811 Atelectasis: Secondary | ICD-10-CM | POA: Diagnosis not present

## 2017-06-15 DIAGNOSIS — R600 Localized edema: Secondary | ICD-10-CM | POA: Diagnosis present

## 2017-06-15 DIAGNOSIS — R296 Repeated falls: Secondary | ICD-10-CM | POA: Diagnosis present

## 2017-06-15 DIAGNOSIS — R531 Weakness: Secondary | ICD-10-CM | POA: Diagnosis not present

## 2017-06-15 DIAGNOSIS — D6861 Antiphospholipid syndrome: Secondary | ICD-10-CM | POA: Diagnosis present

## 2017-06-15 DIAGNOSIS — R001 Bradycardia, unspecified: Secondary | ICD-10-CM | POA: Diagnosis present

## 2017-06-15 DIAGNOSIS — R9401 Abnormal electroencephalogram [EEG]: Secondary | ICD-10-CM | POA: Diagnosis present

## 2017-06-15 DIAGNOSIS — Z7901 Long term (current) use of anticoagulants: Secondary | ICD-10-CM

## 2017-06-15 HISTORY — DX: Other symptoms and signs involving the musculoskeletal system: R29.898

## 2017-06-15 LAB — CBC
HEMATOCRIT: 33 % — AB (ref 39.0–52.0)
HEMOGLOBIN: 10.3 g/dL — AB (ref 13.0–17.0)
MCH: 28.1 pg (ref 26.0–34.0)
MCHC: 31.2 g/dL (ref 30.0–36.0)
MCV: 90.2 fL (ref 78.0–100.0)
Platelets: 148 10*3/uL — ABNORMAL LOW (ref 150–400)
RBC: 3.66 MIL/uL — AB (ref 4.22–5.81)
RDW: 14.7 % (ref 11.5–15.5)
WBC: 2.9 10*3/uL — ABNORMAL LOW (ref 4.0–10.5)

## 2017-06-15 LAB — ETHANOL: Alcohol, Ethyl (B): <10 mg/dL (ref <10)

## 2017-06-15 LAB — URINALYSIS, ROUTINE W REFLEX MICROSCOPIC
Bilirubin Urine: NEGATIVE
Glucose, UA: NEGATIVE mg/dL
HGB URINE DIPSTICK: NEGATIVE
Ketones, ur: NEGATIVE mg/dL
Leukocytes, UA: NEGATIVE
Nitrite: NEGATIVE
PH: 7 (ref 5.0–8.0)
Protein, ur: NEGATIVE mg/dL
SPECIFIC GRAVITY, URINE: 1.005 (ref 1.005–1.030)

## 2017-06-15 LAB — MAGNESIUM: Magnesium: 1.9 mg/dL (ref 1.7–2.4)

## 2017-06-15 LAB — HEPATIC FUNCTION PANEL
ALK PHOS: 90 U/L (ref 38–126)
ALT: 46 U/L (ref 17–63)
AST: 73 U/L — AB (ref 15–41)
Albumin: 3.4 g/dL — ABNORMAL LOW (ref 3.5–5.0)
BILIRUBIN DIRECT: 0.1 mg/dL (ref 0.1–0.5)
Indirect Bilirubin: 1 mg/dL — ABNORMAL HIGH (ref 0.3–0.9)
TOTAL PROTEIN: 7.6 g/dL (ref 6.5–8.1)
Total Bilirubin: 1.1 mg/dL (ref 0.3–1.2)

## 2017-06-15 LAB — CBG MONITORING, ED
Glucose-Capillary: 114 mg/dL — ABNORMAL HIGH (ref 65–99)
Glucose-Capillary: 101 mg/dL — ABNORMAL HIGH (ref 65–99)

## 2017-06-15 LAB — RAPID URINE DRUG SCREEN, HOSP PERFORMED
Amphetamines: NOT DETECTED
BENZODIAZEPINES: NOT DETECTED
Barbiturates: NOT DETECTED
Cocaine: NOT DETECTED
OPIATES: NOT DETECTED
TETRAHYDROCANNABINOL: NOT DETECTED

## 2017-06-15 LAB — VITAMIN B12: VITAMIN B 12: 713 pg/mL (ref 180–914)

## 2017-06-15 LAB — PROTIME-INR
INR: 2.85
Prothrombin Time: 29.7 seconds — ABNORMAL HIGH (ref 11.4–15.2)

## 2017-06-15 LAB — BASIC METABOLIC PANEL
ANION GAP: 9 (ref 5–15)
BUN: 16 mg/dL (ref 6–20)
CHLORIDE: 101 mmol/L (ref 101–111)
CO2: 23 mmol/L (ref 22–32)
Calcium: 9.1 mg/dL (ref 8.9–10.3)
Creatinine, Ser: 0.86 mg/dL (ref 0.61–1.24)
GFR calc non Af Amer: >60 mL/min (ref >60)
GLUCOSE: 126 mg/dL — AB (ref 65–99)
POTASSIUM: 4.3 mmol/L (ref 3.5–5.1)
Sodium: 133 mmol/L — ABNORMAL LOW (ref 135–145)

## 2017-06-15 LAB — BRAIN NATRIURETIC PEPTIDE: B Natriuretic Peptide: 176.9 pg/mL — ABNORMAL HIGH (ref 0.0–100.0)

## 2017-06-15 LAB — TROPONIN I
Troponin I: <0.03 ng/mL (ref <0.03)
Troponin I: 0.03 ng/mL (ref <0.03)

## 2017-06-15 LAB — AMMONIA: Ammonia: 23 umol/L (ref 9–35)

## 2017-06-15 LAB — TSH: TSH: 6.256 u[IU]/mL — ABNORMAL HIGH (ref 0.350–4.500)

## 2017-06-15 MED ORDER — LEVETIRACETAM 500 MG/5ML IV SOLN
1000.0000 mg | Freq: Once | INTRAVENOUS | Status: AC
  Administered 2017-06-15: 1000 mg via INTRAVENOUS
  Filled 2017-06-15: qty 10

## 2017-06-15 MED ORDER — SODIUM CHLORIDE 0.9 % IV SOLN
500.0000 mg | Freq: Two times a day (BID) | INTRAVENOUS | Status: DC
  Administered 2017-06-16 – 2017-06-18 (×5): 500 mg via INTRAVENOUS
  Filled 2017-06-15 (×9): qty 5

## 2017-06-15 MED ORDER — IOPAMIDOL (ISOVUE-370) INJECTION 76%
INTRAVENOUS | Status: AC
  Administered 2017-06-15: 50 mL
  Filled 2017-06-15: qty 50

## 2017-06-15 NOTE — Consult Note (Signed)
Neurology Consultation  Reason for Consult: AMS, generalized weakness, falls, decreased level of consciousness Referring Physician: Dr Tyron Russell. Joy PA-C  CC: AMS, generalized weakness, falls, decreased level of consciousness  History is obtained from: chart, patient's son  HPI: [name] is a 77 y.o. male PMH Lt parieto occipital stroke in 2000 with no residual weakness, Antiphospholipid antibody syndrome on coumadin with a therapeutic INR, complex partial seizures after the stroke on Keppra unknown compliance, presented to the emergency room for evaluation of abnormal sensation in his extremities.  Per the ER documentation he noticed some abnormalities around 7 AM when he felt that he had a shocklike sensation to everything he touched and he kept hearing strange sounds. Seen and evaluated in the emergency room with a CT scan and MRI of the brain and MRI of the C-spine which were all unremarkable. While in the ER, he kept becoming more drowsy and difficult to awake.  He is maintaining his oxygen saturations but remains sleepy. There was some concern of possible hyperreflexia which I was unable to elicit at this time. The son said that he has been complaining of having had multiple falls over the past few days and had an episode where he fell and then was unconscious for a little while without realizing.  He realizes after the fact when he looked at the time.  He is a retired Optometrist and at baseline is cognitively very intact per his son.  His last seen normal was this past Saturday. Patient told me that he feels well now but is just sleepy and wants to sleep. No reported seizure activity.  No bowel bladder incontinence.  No evidence of tongue bite. Patient has been having trouble with controlling his neck and has been diagnosed with dropped head syndrome.  He has frank weakness of his neck extensors.  No dysphasia or dysarthria.  ROS:  Unable to obtain due to altered mental status.    Past Medical History:  Diagnosis Date  . Antiphospholipid syndrome ([place]) 05/06/2012   Overview:    A. Left MCA stroke, Mar 2000   B. Recurrent MCA stroke while subtherapeutic on warfarin, 2000   C. Positive antiphospholipid antibody testing  . Chicken pox   . Chronic anticoagulation 05/06/2012  . Complex partial seizure ([place]) 04/12/2015  . Dropped head syndrome   . 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 Kyphosis   . Stroke [facility]) 2000    Family History  Family history unknown: Yes   Social History:   reports that  has never smoked. he has never used smokeless tobacco. He reports that he does not drink alcohol or use drugs.   Medications  Current Facility-Administered Medications:  .  iopamidol (ISOVUE-370) 76 % injection, , , ,   Current Outpatient Medications:  .  Ascorbic Acid (VITAMIN C) 1000 MG tablet, Take 1,000 mg daily by mouth., Disp: , Rfl:  .  BETAINE, TRIMETHYLGLYCINE, PO, Take 1 tablet by mouth daily. , Disp: , Rfl:  .  Calcium Carbonate (CALCIUM 600 PO), Take 1 tablet daily by mouth., Disp: , Rfl:  .  cholecalciferol (VITAMIN D) 1000 units tablet, Take 1,000 Units daily by mouth., Disp: , Rfl:  .  Magnesium Oxide 250 MG TABS, Take 250 mg by mouth daily., Disp: , Rfl:  .  Misc Natural Products (RA GLUCOSAMINE-CHONDROIT-MSM) TABS, Take 1 tablet by mouth 2 (two) times daily., Disp: , Rfl:  .  niacin 100 MG tablet, Take 50 mg daily by mouth., Disp: ,  Rfl:  .  Pyridoxine HCl (B-6 PO), Take 50 mg daily by mouth., Disp: , Rfl:  .  vitamin B-12 (CYANOCOBALAMIN) 1000 MCG tablet, Take 1,000 mcg daily by mouth., Disp: , Rfl:  .  vitamin E 400 UNIT capsule, Take 400 Units daily by mouth., Disp: , Rfl:  .  warfarin (COUMADIN) 5 MG tablet, Take 1 tablet (5 mg total) by mouth as directed. (Patient taking differently: Take 5-7.5 mg by mouth See admin instructions. 5mg  on Mon/Wed/Fri 7.5mg  on all other days), Disp: 120 tablet, Rfl: 1 .  Zinc 50 MG CAPS, Take 1 capsule daily by  mouth., Disp: , Rfl:    Exam: Current vital signs: BP 104/62   Pulse (!) 53   Temp 98.2 F (36.8 C) (Oral)   Resp 10   SpO2 100%  Vital signs in last 24 hours: Temp:  [98.2 F (36.8 C)] 98.2 F (36.8 C) (01/08 1546) Pulse Rate:  [52-74] 53 (01/08 2100) Resp:  [9-18] 10 (01/08 2100) BP: (104-175)/(54-134) 104/62 (01/08 2100) SpO2:  [95 %-100 %] 100 % (01/08 2100) GENERAL: drowsy, wakes up to loud voice. HEENT: - Normocephalic and atraumatic, dry mm, no LN++, no Thyromegally.  Neck extensor weakness, grossly stiff paraspinal muscles on palpation, nontender. LUNGS - Clear to auscultation bilaterally with no wheezes CV - S1S2 RRR, no m/r/g, equal pulses bilaterally. ABDOMEN - Soft, nontender, nondistended with normoactive BS Ext: 2+ edema till below the knees bilaterally NEURO:  Mental Status: Drowsy, wakes to voice. Follows commands. Poor attention and concentration. Language: speech is clear.  Naming, repetition, fluency, and comprehension intact Cranial Nerves: PERRL 16mm/brisk. EOMI, visual fields full, no facial asymmetry, facial sensation intact, hearing intact, tongue/uvula/soft palate midline, normal sternocleidomastoid and trapezius muscle strength. No evidence of tongue atrophy or fibrillations Motor: 5/5 all over with exception of 4/5 neck extensors. Tone: is normal and bulk is normal Sensation- Intact to light touch bilaterally Coordination: FTN intact bilaterally Gait- deferred  Labs I have reviewed labs in epic and the results pertinent to this consultation are: WBC 2.9, hemoglobin 10.3, sodium 133, glucose 126, creatinine 0.86, AST 73.  Ammonia 23. Urinary toxicology screen negative for amphetamine barbiturates benzodiazepine opiates cocaine and THC.  Alcohol level is undetectable. Unremarkable urinalysis. CBC    Component Value Date/Time   WBC 2.9 (L) 06/15/2017 1336   RBC 3.66 (L) 06/15/2017 1336   HGB 10.3 (L) 06/15/2017 1336   HCT 33.0 (L) 06/15/2017 1336    PLT 148 (L) 06/15/2017 1336   MCV 90.2 06/15/2017 1336   MCH 28.1 06/15/2017 1336   MCHC 31.2 06/15/2017 1336   RDW 14.7 06/15/2017 1336    CMP     Component Value Date/Time   NA 133 (L) 06/15/2017 1336   K 4.3 06/15/2017 1336   CL 101 06/15/2017 1336   CO2 23 06/15/2017 1336   GLUCOSE 126 (H) 06/15/2017 1336   BUN 16 06/15/2017 1336   CREATININE 0.86 06/15/2017 1336   CALCIUM 9.1 06/15/2017 1336   PROT 7.6 06/15/2017 1600   ALBUMIN 3.4 (L) 06/15/2017 1600   AST 73 (H) 06/15/2017 1600   ALT 46 06/15/2017 1600   ALKPHOS 90 06/15/2017 1600   BILITOT 1.1 06/15/2017 1600   GFRNONAA >60 06/15/2017 1336   GFRAA >60 06/15/2017 1336   Imaging I have reviewed the images obtained: CT-scan of the brain - no acute changes. Prior left parieto-occipital stroke. MRI examination of the brain - severely motion degraded, no acute changes. MRI C-spine - Diffuse  spondylosis. Bilateral C5-6, C6-7 and C7-T1, +kyphosis.  Assessment:  77 year old man with a past history of left parieto-occipital stroke in 2000 with no residual weakness, antiphospholipid antibody syndrome on Coumadin, presented to the emergency room for evaluation of abnormal sensation in his extremities and feeling shocklike sensation everywhere since waking up this morning. Last known normal for the symptoms was last night but he has been having multiple falls ever since Saturday and had an episode of loss of consciousness. No witnessed seizure activity. His presentation is very bizarre and puzzling at this time. Imaging studies are unremarkable. He does have a lot of bilateral pitting edema in lower extremities, which according to his son and him is not normal. He was also bradycardic on the monitor. Also has had next extensor weakness for the past 2 months for which she has been getting physical therapy.  Impression: -Unclear etiology of encephalopathy -Evaluate for seizures with prolonged post-ictal state -Evaluate for  syncope, orthostatic hypotension. -Neck extensor weakness-consider evaluation for myasthenia versus Lambert-Eaton myasthenic syndrome. Consider eval for systemic malignancy  Recommendations: -Chest x-ray -CT of the head and neck -Orthostatic vitals -Check TSH, B12 -ordered -Check Keppra levels prior to giving him Keppra-1000 mg IV x1.-Ordered -Continue with Keppra 500 twice daily after that. - ordered -If he continues to remain encephalopathic and no other causes identified, he might need a spinal tap.  He is currently on Coumadin and is with a therapeutic INR.  We will have to discontinue the Coumadin and wait for INR to drop to safe levels before tap was performed. -Send [facility] receptor binding, blocking and modulating antibodies -ordered -Send voltage gated calcium channel antibodies.-Ordered -Will need outpatient EMG nerve conduction studies upon discharge. -Routine EEG in the morning-ordered -Consider scanning spine chest/abdomen/pelvis for evidence of systemic malignancy   -- Amie Portland, MD [facility] Pager: [phone] If 7pm to 7am, please call on call as listed on AMION.

## 2017-06-15 NOTE — ED Notes (Signed)
RN informed provider of trop and provided new EKG

## 2017-06-15 NOTE — ED Triage Notes (Signed)
Pt arrives with neighbor whom the patient callled and told him "everything he touches feels like it has an Geophysicist/field seismologist" Pt states he started feeling that way this morning. Pt is leaning to the left in triage but states this is normal for him due to neck injury. Pt has no weakness in arms and speech is clear. Pt unable to lift either leg and states he had trouble walking this morning due to weakness in both legs and also 3+ pitting edema.

## 2017-06-15 NOTE — H&P (Signed)
History and Physical    [name]:[id number] DOB: 1941/03/09 DOA: 06/15/2017  Referring MD/NP/PA: Dr Verta Ellen  PCP: Dorothyann Peng, NP   Outpatient Specialists: NONE   Patient coming from: Home  Chief Complaint: Altered mental status with weakness  HPI: [name] is a 77 y.o. male with medical history significant of antiphospholipid syndrome with residual weakness due to stroke in 2000 as well as complex partial seizures on Keppra who was brought in by family secondary to recurrent altered mental status. He initially was complaining of abnormal sensation in both extremities. Patient was having some electric type sensation all over but most especially in the lower extremities. In the ER however he became more drowsy and confused. Patient goes in and out of confusion. Complaining of wanting to sleep all the time. Soreness with the patient who reported multiple episode of decreased level of consciousness that comes and goes. Patient has also been having multiple falls.  Patient has been seen and evaluated by neurology. He has had CT of the brain and MRI so far with no new findings. He is being admitted for further neurologic evaluation of his current altered mental status  ED Course: CT scan of the brain as well as MRI of the brain and C-spine were done. Also far negative. CBC BMP is also no abnormal findings  Review of Systems: As per HPI otherwise 10 point review of systems negative. Mostly confused   Past Medical History:  Diagnosis Date  . Antiphospholipid syndrome ([place]) 05/06/2012   Overview:    A. Left MCA stroke, Mar 2000   B. Recurrent MCA stroke while subtherapeutic on warfarin, 2000   C. Positive antiphospholipid antibody testing  . Chicken pox   . Chronic anticoagulation 05/06/2012  . Complex partial seizure ([place]) 04/12/2015  . Dropped head syndrome   . 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 Kyphosis   . Stroke [facility]) 2000    Past Surgical History:    Procedure Laterality Date  . HERNIA REPAIR Left 2008     reports that  has never smoked. he has never used smokeless tobacco. He reports that he does not drink alcohol or use drugs.  Allergies  Allergen Reactions  . Aspirin     Other reaction(s): Other (See Comments) Other Reaction: GI Upset    Family History  Family history unknown: Yes     Prior to Admission medications   Medication Sig Start Date End Date Taking? Authorizing Provider  Ascorbic Acid (VITAMIN C) 1000 MG tablet Take 1,000 mg daily by mouth.   Yes [provider]  BETAINE, TRIMETHYLGLYCINE, PO Take 1 tablet by mouth daily.    Yes [provider]  Calcium Carbonate (CALCIUM 600 PO) Take 1 tablet daily by mouth.   Yes [provider]  cholecalciferol (VITAMIN D) 1000 units tablet Take 1,000 Units daily by mouth.   Yes [provider]  Magnesium Oxide 250 MG TABS Take 250 mg by mouth daily.   Yes [provider]  Misc Natural Products (RA GLUCOSAMINE-CHONDROIT-MSM) TABS Take 1 tablet by mouth 2 (two) times daily.   Yes [provider]  niacin 100 MG tablet Take 50 mg daily by mouth.   Yes [provider]  Pyridoxine HCl (B-6 PO) Take 50 mg daily by mouth.   Yes [provider]  vitamin B-12 (CYANOCOBALAMIN) 1000 MCG tablet Take 1,000 mcg daily by mouth.   Yes [provider]  vitamin E 400 UNIT capsule Take 400  Units daily by mouth.   Yes [provider]  warfarin (COUMADIN) 5 MG tablet Take 1 tablet (5 mg total) by mouth as directed. Patient taking differently: Take 5-7.5 mg by mouth See admin instructions. 5mg  on Mon/Wed/Fri 7.5mg  on all other days 09/19/10  Yes Wall, Marijo Conception, MD  Zinc 50 MG CAPS Take 1 capsule daily by mouth.   Yes [provider]    Physical Exam: Vitals:   06/15/17 2145 06/15/17 2200 06/15/17 2215 06/15/17 2230  BP: 133/68 (!) 154/81 (!) 169/89 139/72  Pulse: (!) 59 64 68 (!) 54  Resp: 10 (!)  8 15 13   Temp:      TempSrc:      SpO2: 99% 97% 96% 100%      Constitutional: NAD, calm, comfortable Vitals:   06/15/17 2145 06/15/17 2200 06/15/17 2215 06/15/17 2230  BP: 133/68 (!) 154/81 (!) 169/89 139/72  Pulse: (!) 59 64 68 (!) 54  Resp: 10 (!) 8 15 13   Temp:      TempSrc:      SpO2: 99% 97% 96% 100%   Eyes: PERRL, lids and conjunctivae normal ENMT: Mucous membranes are moist. Posterior pharynx clear of any exudate or lesions.Normal dentition.  Neck: normal, supple, no masses, no thyromegaly Respiratory: clear to auscultation bilaterally, no wheezing, no crackles. Normal respiratory effort. No accessory muscle use.  Cardiovascular: Regular rate and rhythm, no murmurs / rubs / gallops. No extremity edema. 2+ pedal pulses. No carotid bruits.  Abdomen: no tenderness, no masses palpated. No hepatosplenomegaly. Bowel sounds positive.  Musculoskeletal: no clubbing / cyanosis. No joint deformity upper and lower extremities. Good ROM, no contractures. Normal muscle tone.  Skin: no rashes, lesions, ulcers. No induration Neurologic: Confused, sleepy but arousable CN 2-12 grossly intact. Sensation intact, DTR normal. Strength 5/5 in all 4.  Psychiatric: Normal judgment and insight. Alert and oriented x 3. Normal mood.   Labs on Admission: I have personally reviewed following labs and imaging studies  CBC: Recent Labs  Lab 06/15/17 1336  WBC 2.9*  HGB 10.3*  HCT 33.0*  MCV 90.2  PLT 161*   Basic Metabolic Panel: Recent Labs  Lab 06/15/17 1336 06/15/17 1601  NA 133*  --   K 4.3  --   CL 101  --   CO2 23  --   GLUCOSE 126*  --   BUN 16  --   CREATININE 0.86  --   CALCIUM 9.1  --   MG  --  1.9   GFR: CrCl cannot be calculated (Unknown ideal weight.). Liver Function Tests: Recent Labs  Lab 06/15/17 1600  AST 73*  ALT 46  ALKPHOS 90  BILITOT 1.1  PROT 7.6  ALBUMIN 3.4*   No results for input(s): LIPASE, AMYLASE in the last 168 hours. Recent Labs  Lab  06/15/17 1600  AMMONIA 23   Coagulation Profile: Recent Labs  Lab 06/15/17 1600  INR 2.85   Cardiac Enzymes: Recent Labs  Lab 06/15/17 1600  TROPONINI <0.03   BNP (last 3 results) No results for input(s): PROBNP in the last 8760 hours. HbA1C: No results for input(s): HGBA1C in the last 72 hours. CBG: Recent Labs  Lab 06/15/17 1601 06/15/17 2056  GLUCAP 114* 101*   Lipid Profile: No results for input(s): CHOL, HDL, LDLCALC, TRIG, CHOLHDL, LDLDIRECT in the last 72 hours. Thyroid Function Tests: No results for input(s): TSH, T4TOTAL, FREET4, T3FREE, THYROIDAB in the last 72 hours. Anemia Panel: No results for input(s): VITAMINB12, FOLATE,  FERRITIN, TIBC, IRON, RETICCTPCT in the last 72 hours. Urine analysis:    Component Value Date/Time   COLORURINE STRAW (A) 06/15/2017 1720   APPEARANCEUR CLEAR 06/15/2017 1720   LABSPEC 1.005 06/15/2017 1720   PHURINE 7.0 06/15/2017 1720   GLUCOSEU NEGATIVE 06/15/2017 1720   HGBUR NEGATIVE 06/15/2017 1720   BILIRUBINUR NEGATIVE 06/15/2017 1720   KETONESUR NEGATIVE 06/15/2017 1720   PROTEINUR NEGATIVE 06/15/2017 1720   NITRITE NEGATIVE 06/15/2017 1720   LEUKOCYTESUR NEGATIVE 06/15/2017 1720   Sepsis Labs: @LABRCNTIP (procalcitonin:4,lacticidven:4) )No results found for this or any previous visit (from the past 240 hour(s)).   Radiological Exams on Admission: Dg Chest 2 View  Result Date: 06/15/2017 CLINICAL DATA:  Peripheral edema. EXAM: CHEST  2 VIEW COMPARISON:  Radiograph 10/13/2007 FINDINGS: Borderline cardiomegaly. Mild central vascular congestion without pulmonary edema. Streaky bibasilar atelectasis. No pleural fluid or focal consolidation. No pneumothorax. No acute osseous abnormalities are seen. IMPRESSION: Borderline cardiomegaly and mild central vascular congestion. Streaky bibasilar atelectasis. Electronically Signed   By: Jeb Levering M.D.   On: 06/15/2017 22:35   Ct Head Wo Contrast  Result Date:  06/15/2017 CLINICAL DATA:  Altered mental status, nonresponsive EXAM: CT HEAD WITHOUT CONTRAST TECHNIQUE: Contiguous axial images were obtained from the base of the skull through the vertex without intravenous contrast. COMPARISON:  Oct 13, 2007 FINDINGS: Brain: Mild diffuse atrophy is stable. There is a cavum septum pellucidum and cavum vergae, an anatomic variant. There is no intracranial mass, hemorrhage, extra-axial fluid collection, or midline shift. There is a stable prior infarct at the left temporoparietal junction, stable. Elsewhere, there is slight small vessel disease in the centra semiovale bilaterally. No new gray-white compartment lesions evident. No acute infarct appreciable. There are calcifications in the basal ganglia region, stable and felt to be physiologic. Vascular: There is no appreciable hyperdense vessel. There is calcification in each carotid siphon. Skull: Bony calvarium appears intact. Sinuses/Orbits: There is mucosal thickening and opacification involving multiple ethmoid air cells. Other visualized paranasal sinuses are clear. Orbits appear symmetric bilaterally. Other: Mastoids on the right are clear. There is extensive mastoid disease on the left, also present on prior study. There is debris in each external auditory canal. IMPRESSION: 1. Mild atrophy with prior left temporal-parietal junction infarct, stable. There is slight periventricular small vessel disease. No acute infarct evident. No mass or hemorrhage. 2.  There are foci of arterial vascular calcification. 3.  Ethmoid sinus disease bilaterally. 4. Chronic mastoid disease on the left. Probable cerumen in each external auditory canal. Electronically Signed   By: Lowella Grip III M.D.   On: 06/15/2017 16:30   Mr Brain Wo Contrast  Result Date: 06/15/2017 CLINICAL DATA:  Acute dysesthesias. EXAM: MRI HEAD WITHOUT CONTRAST TECHNIQUE: Multiplanar, multiecho pulse sequences of the brain and surrounding structures were obtained  without intravenous contrast. COMPARISON:  CT same day FINDINGS: The study suffers from motion degradation. Brain: Diffusion imaging does not show any acute or subacute infarction. Brainstem and cerebellum are unremarkable. Cerebral hemispheres show an old infarction in the left parietal lobe which has progressed to atrophy, encephalomalacia and gliosis. There are mild chronic small-vessel changes elsewhere in the deep white matter. No mass lesion, hemorrhage, hydrocephalus or extra-axial collection. Vascular: Major vessels at the base of the brain show flow. Skull and upper cervical spine: Negative Sinuses/Orbits: Left maxillary sinusitis. Left mastoid effusion. Orbits negative. Other: None IMPRESSION: No acute finding. Motion degraded exam. Old left parietal cortical and subcortical infarction. Left maxillary sinus mucosal inflammatory disease. Left  mastoid effusion. Electronically Signed   By: Nelson Chimes M.D.   On: 06/15/2017 19:41   Mr Cervical Spine Wo Contrast  Result Date: 06/15/2017 CLINICAL DATA:  Acute dysesthesias. EXAM: MRI CERVICAL SPINE WITHOUT CONTRAST TECHNIQUE: Multiplanar, multisequence MR imaging of the cervical spine was performed. No intravenous contrast was administered. COMPARISON:  None. FINDINGS: Significantly motion degraded exam. Alignment: Kyphotic curvature throughout the cervical region. Vertebrae: No evidence of fracture or focal bone lesion. Cord: No evidence of cord compression or primary cord lesion. Posterior Fossa, vertebral arteries, paraspinal tissues: Negative Disc levels: Disc degeneration throughout the cervical region with desiccation and loss of height. At no level is there appear to be a disc herniation. No compressive stenosis of the canal. There is foraminal stenosis bilaterally at C5-6, C6-7 and C7-T1 which could possibly cause neural compression. IMPRESSION: Limited information based on motion degradation throughout the exam. Kyphotic curvature of the thoracic  spine. Diffuse cervical spondylosis. No compressive central canal stenosis. Bilateral foraminal narrowing at C5-6, C6-7 and C7-T1 which could possibly be associated with neural compression. Electronically Signed   By: Nelson Chimes M.D.   On: 06/15/2017 19:38    EKG: Independently reviewed.   Assessment/Plan Principal Problem:   Encephalopathy acute Active Problems:   Antiphospholipid syndrome (HCC)   Complex partial seizure (HCC)   Chronic anticoagulation   Encephalopathy    #1 acute encephalopathy: Appears to be recurrent. So far no obvious cause. Appreciate neurology input. We'll follow recommendations from neurology and if all turn out negative may need lumbar puncture. At that point we will reverse INR temporarily for the procedure.  #2 history of seizure: Patient's symptoms may be related to complex partial seizures with post ictal symptoms. Currently on Keppra. Appreciates neurology input will follow the levels.  #3 antiphospholipid syndrome: Patient on chronic anticoagulation with warfarin. He has had previous CVA. Continue warfarin per pharmacy.  #4 history of dropped head syndrome: Again continue according to neurology.   DVT prophylaxis: Warfarin   Code Status: Full   Family Communication: Son Physicist, medical   Disposition Plan: Home   Consults called: Neurology, Dr Weldon Picking  Admission status: inpatient   Severity of Illness: The appropriate patient status for this patient is INPATIENT. Inpatient status is judged to be reasonable and necessary in order to provide the required intensity of service to ensure the patient's safety. The patient's presenting symptoms, physical exam findings, and initial radiographic and laboratory data in the context of their chronic comorbidities is felt to place them at high risk for further clinical deterioration. Furthermore, it is not anticipated that the patient will be medically stable for discharge from the hospital within 2 midnights of  admission. The following factors support the patient status of inpatient.   " The patient's presenting symptoms include confusion. " The worrisome physical exam findings include altered mental status. " The initial radiographic and laboratory data are worrisome because of no obvious cause. " The chronic co-morbidities include antiphospholipid syndrome.   * I certify that at the point of admission it is my clinical judgment that the patient will require inpatient hospital care spanning beyond 2 midnights from the point of admission due to high intensity of service, high risk for further deterioration and high frequency of surveillance required.Barbette Merino MD [facility] Pager [phone]  If 7PM-7AM, please contact night-coverage [email or web address] Password [facility]  06/15/2017, 10:48 PM

## 2017-06-15 NOTE — ED Notes (Signed)
Pt snore through placement of iv but rouses to ask about sensation on tongue.

## 2017-06-15 NOTE — ED Notes (Signed)
Pt brought back to the room in wheelchair from the waiting room, Pt would not help to get out of the wheelchair or help get undressed. Pt was slumping over towards the left side and not talking to [place] at all. Once pt was in bed he would hold himself up but still not respond to questions being asked.

## 2017-06-15 NOTE — ED Notes (Signed)
Patient transported to MRI 

## 2017-06-15 NOTE — ED Notes (Signed)
Patient transported to CT 

## 2017-06-15 NOTE — ED Provider Notes (Signed)
[facility] EMERGENCY DEPARTMENT Provider Note   CSN: [id number] Arrival date & time: 06/15/17  1317     History   Chief Complaint Chief Complaint  Patient presents with  . Weakness    HPI [name] is a 77 y.o. male.  HPI   [name] is a 77 y.o. male, with a history of CVA, partial seizures, cervical kyphosis, dropped head syndrome, and antiphospholipid antibody syndrome, presenting to the ED with abnormal sensation in the extremities. [name] noticed these abnormalities around 7 AM upon waking.  [name] states [name] was last normal at 12:30 AM this morning when [name] went to bed. "Everything I touch gives me a shock." "I also keep hearing a strange sound. It's intermittent, but I don't know what it is. I've never heard it before." Patient keeps repeating that [name] hears this sound. [name] does answer all questions, but [name] does not give any more description of his symptoms than those above. Only other abnormality [name] has noticed is that his left eye is red. [name] does not have pain or vision abnormality.  States [name] has no lasting deficits from his stroke in 2000.  Denies CP, SOB, acute peripheral edema, headache, weakness, fall/trauma, N/V/D, fever/chills, dizziness, or any other complaints.     Past Medical History:  Diagnosis Date  . Antiphospholipid syndrome ([place]) 05/06/2012   Overview:    A. Left MCA stroke, Mar 2000   B. Recurrent MCA stroke while subtherapeutic on warfarin, 2000   C. Positive antiphospholipid antibody testing  . Chicken pox   . Chronic anticoagulation 05/06/2012  . Complex partial seizure ([place]) 04/12/2015  . Dropped head syndrome   . 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 Kyphosis   . Stroke [facility]) 2000    Patient Active Problem List   Diagnosis Date Noted  . Encephalopathy acute 06/15/2017  . Encephalopathy 06/15/2017  . Complex partial seizure ([place]) 04/12/2015  . Encounter for therapeutic drug monitoring 06/30/2013  . Stroke ([place])  06/12/2013  . Arthritis 08/26/2012  . Antiphospholipid syndrome ([place]) 05/06/2012  . 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 Chronic anticoagulation 05/06/2012  . Long term (current) use of anticoagulants 11/14/2010  . TIA 02/25/2009    Past Surgical History:  Procedure Laterality Date  . HERNIA REPAIR Left 2008       Home Medications    Prior to Admission medications   Medication Sig Start Date End Date Taking? Authorizing Provider  Ascorbic Acid (VITAMIN C) 1000 MG tablet Take 1,000 mg daily by mouth.   Yes [provider]  BETAINE, TRIMETHYLGLYCINE, PO Take 1 tablet by mouth daily.    Yes [provider]  Calcium Carbonate (CALCIUM 600 PO) Take 1 tablet daily by mouth.   Yes [provider]  cholecalciferol (VITAMIN D) 1000 units tablet Take 1,000 Units daily by mouth.   Yes [provider]  Magnesium Oxide 250 MG TABS Take 250 mg by mouth daily.   Yes [provider]  Misc Natural Products (RA GLUCOSAMINE-CHONDROIT-MSM) TABS Take 1 tablet by mouth 2 (two) times daily.   Yes [provider]  niacin 100 MG tablet Take 50 mg daily by mouth.   Yes [provider]  Pyridoxine HCl (B-6 PO) Take 50 mg daily by mouth.   Yes [provider]  vitamin B-12 (CYANOCOBALAMIN) 1000 MCG tablet Take 1,000 mcg daily by mouth.   Yes [provider]  vitamin E 400 UNIT capsule Take 400 Units daily by  mouth.   Yes [provider]  warfarin (COUMADIN) 5 MG tablet Take 1 tablet (5 mg total) by mouth as directed. Patient taking differently: Take 5-7.5 mg by mouth See admin instructions. 5mg  on Mon/Wed/Fri 7.5mg  on all other days 09/19/10  Yes Wall, Marijo Conception, MD  Zinc 50 MG CAPS Take 1 capsule daily by mouth.   Yes [provider]    Family History Family History  Family history unknown: Yes    Social History Social History   Tobacco Use  . Smoking status: Never Smoker  . Smokeless tobacco:  Never Used  Substance Use Topics  . Alcohol use: No    Frequency: Never  . Drug use: No     Allergies   Aspirin   Review of Systems Review of Systems  Constitutional: Negative for chills and fever.  Respiratory: Negative for shortness of breath.   Cardiovascular: Negative for chest pain.  Gastrointestinal: Negative for abdominal pain, diarrhea, nausea and vomiting.  Musculoskeletal: Negative for neck pain.  Neurological: Negative for dizziness, syncope, facial asymmetry, speech difficulty, weakness, light-headedness, numbness and headaches.       Abnormal sensation in the extremities  All other systems reviewed and are negative.    Physical Exam Updated Vital Signs BP 120/62 (BP Location: Left Arm)   Pulse 61   Resp 16   SpO2 98%   Physical Exam  Constitutional: [name] is oriented to person, place, and time. [name] appears well-developed and well-nourished. No distress.  HENT:  Head: Normocephalic and atraumatic.  Mouth/Throat: Oropharynx is clear and moist.  Cerumen impaction noted bilaterally.  Eyes: Conjunctivae and EOM are normal. Pupils are equal, round, and reactive to light.  Left scleral injection  Neck: Neck supple.  Cardiovascular: Normal rate, regular rhythm, normal heart sounds and intact distal pulses.  Pulmonary/Chest: Effort normal and breath sounds normal. No respiratory distress.  Abdominal: Soft. There is no tenderness. There is no guarding.  Musculoskeletal: [name] exhibits edema. [name] exhibits no tenderness.  Peripheral lower extremity edema present, left worse than right. Patient states this is chronic for him.  Patient's head is dropped to the left and [name] has difficulty sitting completely upright.  [name] states this is chronic for him. Normal motor function intact in all extremities and spine. No midline spinal tenderness.   Lymphadenopathy:    [name] has no cervical adenopathy.  Neurological: [name] is alert and oriented to person, place, and time.  Patient answers  orientation questions correctly.  [name] follows commands, however, some of his answers are noted to be somewhat delayed.  When [name] is asked some questions, [name] will sometimes just repeat [name] is "hearing abnormal sounds and feeling shocks." No sensory deficits.  No noted speech deficits. No aphasia. Patient handles oral secretions without difficulty. No noted swallowing defects.  Equal grip strength bilaterally. Strength 5/5 in the upper extremities. Strength 5/5 with flexion and extension of the hips, knees, and ankles bilaterally.  Patellar DTRs 2+ bilaterally. Negative Romberg. No gait disturbance, ambulatory without assistance.  Coordination intact including heel to shin and finger to nose.  Cranial nerves III-XII grossly intact.  No facial droop.   Skin: Skin is warm and dry. Capillary refill takes less than 2 seconds. [name] is not diaphoretic.  Psychiatric: [name] has a normal mood and affect. His behavior is normal.  Nursing note and vitals reviewed.    ED Treatments / Results  Labs (all labs ordered are listed, but only abnormal results are displayed) Labs Reviewed  BASIC METABOLIC  PANEL - Abnormal; Notable for the following components:      Result Value   Sodium 133 (*)    Glucose, Bld 126 (*)    All other components within normal limits  CBC - Abnormal; Notable for the following components:   WBC 2.9 (*)    RBC 3.66 (*)    Hemoglobin 10.3 (*)    HCT 33.0 (*)    Platelets 148 (*)    All other components within normal limits  URINALYSIS, ROUTINE W REFLEX MICROSCOPIC - Abnormal; Notable for the following components:   Color, Urine STRAW (*)    All other components within normal limits  HEPATIC FUNCTION PANEL - Abnormal; Notable for the following components:   Albumin 3.4 (*)    AST 73 (*)    Indirect Bilirubin 1.0 (*)    All other components within normal limits  PROTIME-INR - Abnormal; Notable for the following components:   Prothrombin Time 29.7 (*)    All other components  within normal limits  TSH - Abnormal; Notable for the following components:   TSH 6.256 (*)    All other components within normal limits  BRAIN NATRIURETIC PEPTIDE - Abnormal; Notable for the following components:   B Natriuretic Peptide 176.9 (*)    All other components within normal limits  TROPONIN I - Abnormal; Notable for the following components:   Troponin I 0.03 (*)    All other components within normal limits  CBG MONITORING, ED - Abnormal; Notable for the following components:   Glucose-Capillary 114 (*)    All other components within normal limits  CBG MONITORING, ED - Abnormal; Notable for the following components:   Glucose-Capillary 101 (*)    All other components within normal limits  TROPONIN I  RAPID URINE DRUG SCREEN, HOSP PERFORMED  ETHANOL  AMMONIA  MAGNESIUM  VITAMIN B12  LEVETIRACETAM LEVEL  ACETYLCHOLINE RECEPTOR AB, ALL  MISC [facility] TEST (SEND OUT)  TROPONIN I    EKG  EKG Interpretation  Date/Time:  Tuesday June 15 2017 13:23:59 EST Ventricular Rate:  70 PR Interval:  218 QRS Duration: 152 QT Interval:  454 QTC Calculation: 490 R Axis:   -52 Text Interpretation:  Sinus rhythm with 1st degree A-V block Left axis deviation Left ventricular hypertrophy with QRS widening and repolarization abnormality Abnormal ECG QRS wider but otherwise ECG without significant change since 2009 Confirmed by Sherwood Gambler [phone]) on 06/15/2017 3:13:05 PM       Radiology Ct Angio Head W Or Wo Contrast  Result Date: 06/15/2017 CLINICAL DATA:  Bilateral lower extremity weakness EXAM: CT ANGIOGRAPHY HEAD AND NECK TECHNIQUE: Multidetector CT imaging of the head and neck was performed using the standard protocol during bolus administration of intravenous contrast. Multiplanar CT image reconstructions and MIPs were obtained to evaluate the vascular anatomy. Carotid stenosis measurements (when applicable) are obtained utilizing NASCET criteria, using the distal internal  carotid diameter as the denominator. CONTRAST:  60mL ISOVUE-370 IOPAMIDOL (ISOVUE-370) INJECTION 76% COMPARISON:  Brain MRI and head CT 06/15/2017 FINDINGS: CTA NECK FINDINGS Aortic arch: There is no calcific atherosclerosis of the aortic arch. There is no aneurysm, dissection or hemodynamically significant stenosis of the visualized ascending aorta and aortic arch. There is variant aortic arch anatomy. The brachiocephalic and left common carotid arteries share a common origin. The left vertebral artery arises independently from the aortic arch. The visualized proximal subclavian arteries are normal. Right carotid system: The right common carotid origin is widely patent. There is no common carotid  or internal carotid artery dissection or aneurysm. No hemodynamically significant stenosis. Left carotid system: The left common carotid origin is widely patent. There is no common carotid or internal carotid artery dissection or aneurysm. No hemodynamically significant stenosis. Vertebral arteries: The vertebral system is left dominant. Both vertebral artery origins are normal. Both vertebral arteries are normal to their confluence with the basilar artery. Skeleton: There is no bony spinal canal stenosis. No lytic or blastic lesions. Other neck: The nasopharynx is clear. The oropharynx and hypopharynx are normal. The epiglottis is normal. The supraglottic larynx, glottis and subglottic larynx are normal. No retropharyngeal collection. The parapharyngeal spaces are preserved. The parotid and submandibular glands are normal. No sialolithiasis or salivary ductal dilatation. The thyroid gland is normal. There is no cervical lymphadenopathy. Upper chest: No pneumothorax or pleural effusion. No nodules or masses. Review of the MIP images confirms the above findings CTA HEAD FINDINGS Anterior circulation: --Intracranial internal carotid arteries: Normal. --Anterior cerebral arteries: Normal. --Middle cerebral arteries: There is  moderate narrowing of the proximal M 1 segment of the left middle cerebral artery. The distal branches of the left MCA distribution are attenuated compared to the right. The right MCA is normal. --Posterior communicating arteries: Present on the left, absent on the right. Posterior circulation: --Posterior cerebral arteries: Normal. --Superior cerebellar arteries: Normal. --Basilar artery: Normal. --Anterior inferior cerebellar arteries: Normal. --Posterior inferior cerebellar arteries: Normal. Venous sinuses: As permitted by contrast timing, patent. Anatomic variants: Fetal origin of the left posterior cerebral artery. Delayed phase: No parenchymal contrast enhancement. Unchanged appearance of old left parietal lobe infarct. Review of the MIP images confirms the above findings. IMPRESSION: 1. No emergent large vessel occlusion. 2. Moderate narrowing of the proximal M 1 segment of the left middle cerebral artery with attenuated appearance of the distal branches. This is probably chronic, given the old left posterior MCA distribution infarct. 3. No hemodynamically significant stenosis or other abnormality of the carotid or vertebral arterial systems. Electronically Signed   By: Ulyses Jarred M.D.   On: 06/15/2017 22:46   Dg Chest 2 View  Result Date: 06/15/2017 CLINICAL DATA:  Peripheral edema. EXAM: CHEST  2 VIEW COMPARISON:  Radiograph 10/13/2007 FINDINGS: Borderline cardiomegaly. Mild central vascular congestion without pulmonary edema. Streaky bibasilar atelectasis. No pleural fluid or focal consolidation. No pneumothorax. No acute osseous abnormalities are seen. IMPRESSION: Borderline cardiomegaly and mild central vascular congestion. Streaky bibasilar atelectasis. Electronically Signed   By: Jeb Levering M.D.   On: 06/15/2017 22:35   Ct Head Wo Contrast  Result Date: 06/15/2017 CLINICAL DATA:  Altered mental status, nonresponsive EXAM: CT HEAD WITHOUT CONTRAST TECHNIQUE: Contiguous axial images were  obtained from the base of the skull through the vertex without intravenous contrast. COMPARISON:  Oct 13, 2007 FINDINGS: Brain: Mild diffuse atrophy is stable. There is a cavum septum pellucidum and cavum vergae, an anatomic variant. There is no intracranial mass, hemorrhage, extra-axial fluid collection, or midline shift. There is a stable prior infarct at the left temporoparietal junction, stable. Elsewhere, there is slight small vessel disease in the centra semiovale bilaterally. No new gray-white compartment lesions evident. No acute infarct appreciable. There are calcifications in the basal ganglia region, stable and felt to be physiologic. Vascular: There is no appreciable hyperdense vessel. There is calcification in each carotid siphon. Skull: Bony calvarium appears intact. Sinuses/Orbits: There is mucosal thickening and opacification involving multiple ethmoid air cells. Other visualized paranasal sinuses are clear. Orbits appear symmetric bilaterally. Other: Mastoids on the right are clear.  There is extensive mastoid disease on the left, also present on prior study. There is debris in each external auditory canal. IMPRESSION: 1. Mild atrophy with prior left temporal-parietal junction infarct, stable. There is slight periventricular small vessel disease. No acute infarct evident. No mass or hemorrhage. 2.  There are foci of arterial vascular calcification. 3.  Ethmoid sinus disease bilaterally. 4. Chronic mastoid disease on the left. Probable cerumen in each external auditory canal. Electronically Signed   By: Lowella Grip III M.D.   On: 06/15/2017 16:30   Ct Angio Neck W And/or Wo Contrast  Result Date: 06/15/2017 CLINICAL DATA:  Bilateral lower extremity weakness EXAM: CT ANGIOGRAPHY HEAD AND NECK TECHNIQUE: Multidetector CT imaging of the head and neck was performed using the standard protocol during bolus administration of intravenous contrast. Multiplanar CT image reconstructions and MIPs were  obtained to evaluate the vascular anatomy. Carotid stenosis measurements (when applicable) are obtained utilizing NASCET criteria, using the distal internal carotid diameter as the denominator. CONTRAST:  110mL ISOVUE-370 IOPAMIDOL (ISOVUE-370) INJECTION 76% COMPARISON:  Brain MRI and head CT 06/15/2017 FINDINGS: CTA NECK FINDINGS Aortic arch: There is no calcific atherosclerosis of the aortic arch. There is no aneurysm, dissection or hemodynamically significant stenosis of the visualized ascending aorta and aortic arch. There is variant aortic arch anatomy. The brachiocephalic and left common carotid arteries share a common origin. The left vertebral artery arises independently from the aortic arch. The visualized proximal subclavian arteries are normal. Right carotid system: The right common carotid origin is widely patent. There is no common carotid or internal carotid artery dissection or aneurysm. No hemodynamically significant stenosis. Left carotid system: The left common carotid origin is widely patent. There is no common carotid or internal carotid artery dissection or aneurysm. No hemodynamically significant stenosis. Vertebral arteries: The vertebral system is left dominant. Both vertebral artery origins are normal. Both vertebral arteries are normal to their confluence with the basilar artery. Skeleton: There is no bony spinal canal stenosis. No lytic or blastic lesions. Other neck: The nasopharynx is clear. The oropharynx and hypopharynx are normal. The epiglottis is normal. The supraglottic larynx, glottis and subglottic larynx are normal. No retropharyngeal collection. The parapharyngeal spaces are preserved. The parotid and submandibular glands are normal. No sialolithiasis or salivary ductal dilatation. The thyroid gland is normal. There is no cervical lymphadenopathy. Upper chest: No pneumothorax or pleural effusion. No nodules or masses. Review of the MIP images confirms the above findings CTA HEAD  FINDINGS Anterior circulation: --Intracranial internal carotid arteries: Normal. --Anterior cerebral arteries: Normal. --Middle cerebral arteries: There is moderate narrowing of the proximal M 1 segment of the left middle cerebral artery. The distal branches of the left MCA distribution are attenuated compared to the right. The right MCA is normal. --Posterior communicating arteries: Present on the left, absent on the right. Posterior circulation: --Posterior cerebral arteries: Normal. --Superior cerebellar arteries: Normal. --Basilar artery: Normal. --Anterior inferior cerebellar arteries: Normal. --Posterior inferior cerebellar arteries: Normal. Venous sinuses: As permitted by contrast timing, patent. Anatomic variants: Fetal origin of the left posterior cerebral artery. Delayed phase: No parenchymal contrast enhancement. Unchanged appearance of old left parietal lobe infarct. Review of the MIP images confirms the above findings. IMPRESSION: 1. No emergent large vessel occlusion. 2. Moderate narrowing of the proximal M 1 segment of the left middle cerebral artery with attenuated appearance of the distal branches. This is probably chronic, given the old left posterior MCA distribution infarct. 3. No hemodynamically significant stenosis or other abnormality of  the carotid or vertebral arterial systems. Electronically Signed   By: Ulyses Jarred M.D.   On: 06/15/2017 22:46   Mr Brain Wo Contrast  Result Date: 06/15/2017 CLINICAL DATA:  Acute dysesthesias. EXAM: MRI HEAD WITHOUT CONTRAST TECHNIQUE: Multiplanar, multiecho pulse sequences of the brain and surrounding structures were obtained without intravenous contrast. COMPARISON:  CT same day FINDINGS: The study suffers from motion degradation. Brain: Diffusion imaging does not show any acute or subacute infarction. Brainstem and cerebellum are unremarkable. Cerebral hemispheres show an old infarction in the left parietal lobe which has progressed to atrophy,  encephalomalacia and gliosis. There are mild chronic small-vessel changes elsewhere in the deep white matter. No mass lesion, hemorrhage, hydrocephalus or extra-axial collection. Vascular: Major vessels at the base of the brain show flow. Skull and upper cervical spine: Negative Sinuses/Orbits: Left maxillary sinusitis. Left mastoid effusion. Orbits negative. Other: None IMPRESSION: No acute finding. Motion degraded exam. Old left parietal cortical and subcortical infarction. Left maxillary sinus mucosal inflammatory disease. Left mastoid effusion. Electronically Signed   By: Nelson Chimes M.D.   On: 06/15/2017 19:41   Mr Cervical Spine Wo Contrast  Result Date: 06/15/2017 CLINICAL DATA:  Acute dysesthesias. EXAM: MRI CERVICAL SPINE WITHOUT CONTRAST TECHNIQUE: Multiplanar, multisequence MR imaging of the cervical spine was performed. No intravenous contrast was administered. COMPARISON:  None. FINDINGS: Significantly motion degraded exam. Alignment: Kyphotic curvature throughout the cervical region. Vertebrae: No evidence of fracture or focal bone lesion. Cord: No evidence of cord compression or primary cord lesion. Posterior Fossa, vertebral arteries, paraspinal tissues: Negative Disc levels: Disc degeneration throughout the cervical region with desiccation and loss of height. At no level is there appear to be a disc herniation. No compressive stenosis of the canal. There is foraminal stenosis bilaterally at C5-6, C6-7 and C7-T1 which could possibly cause neural compression. IMPRESSION: Limited information based on motion degradation throughout the exam. Kyphotic curvature of the thoracic spine. Diffuse cervical spondylosis. No compressive central canal stenosis. Bilateral foraminal narrowing at C5-6, C6-7 and C7-T1 which could possibly be associated with neural compression. Electronically Signed   By: Nelson Chimes M.D.   On: 06/15/2017 19:38    Procedures Procedures (including critical care  time)  Medications Ordered in ED Medications  levETIRAcetam (KEPPRA) 500 mg in sodium chloride 0.9 % 100 mL IVPB (not administered)  iopamidol (ISOVUE-370) 76 % injection (50 mLs  Contrast Given 06/15/17 2156)  levETIRAcetam (KEPPRA) 1,000 mg in sodium chloride 0.9 % 100 mL IVPB (0 mg Intravenous Stopped 06/15/17 2307)     Initial Impression / Assessment and Plan / ED Course  I have reviewed the triage vital signs and the nursing notes.  Pertinent labs & imaging results that were available during my care of the patient were reviewed by me and considered in my medical decision making (see chart for details).  Clinical Course as of Jun 15 2350  Tue Jun 15, 2017  1737 Patient now stuttering and stating, "Everywhere I go, every room I go into it's there." States "it" is not something [name] his seeing, but [name] also will not give any more details than this.  [name] now has his hands across his chest and does not want to move them. [name] states [name] is unable to sit up on his own while before [name] had no problems with this. His speech is stuttering. His patellar reflexes are brisk.  Patient's son is now at the bedside and states this is all abnormal for the patient.   [SJ]  74 SPoke with Dr. Leonel Ramsay. States patient needs emergent MR brain and c-spine.   [SJ]  1806 Called MRI to tell them of patient's need for emergent MRI.   [SJ]  2015 Discussed imaging results with patient and patient's son at bedside.  Patient is very self-sufficient and has no cognitive deficits at baseline.  Patient's son last saw him on Sat 1/5 and patient was normal at that time. Son states patient told him [name] has had two falls over the past couple days with a possible loss of consciousness.   [SJ]  2029 Spoke with Dr. Rory Percy, neurologist.  Reviewed the patient's history and imaging.  Requests CTA of the head and neck.  States [name] will come assess the patient personally.  [SJ]  2213 Spoke with Dr. Jonelle Sidle, hospitalist, who agrees to admit the  patient.  [SJ]    Clinical Course User Index [SJ] Finnegan Gatta C, PA-C    Patient presents with what sounds like sensory abnormalities in the extremities.  During his ED course, [name] also began to have some drowsiness and malaise.  Patient has no increased work of breathing and no complaints of chest pain or shortness of breath.  No noted changes on serial EKGs and no changes from previous EKGs. Admission for continued evaluation. Will continue to trend troponins during admission.  Findings and plan of care discussed with Sherwood Gambler, MD. Dr. Regenia Skeeter personally evaluated and examined this patient.  Vitals:   06/15/17 1328 06/15/17 1502 06/15/17 1546  BP: (!) 160/90 120/62   Pulse: 65 61   Resp: 16 16   Temp:   98.2 F (36.8 C)  TempSrc:   Oral  SpO2: 100% 98%    Vitals:   06/15/17 1645 06/15/17 1700 06/15/17 1715 06/15/17 1730  BP: 128/69 (!) 126/54 124/68 (!) 151/107  Pulse: (!) 56 (!) 54 63 69  Resp: (!) 9 10 (!) 9 18  Temp:      TempSrc:      SpO2: 99% 100% 100% 100%     Final Clinical Impressions(s) / ED Diagnoses   Final diagnoses:  Altered mental status, unspecified altered mental status type    ED Discharge Orders    None       Layla Maw 06/15/17 2357    Sherwood Gambler, MD 06/16/17 [phone]

## 2017-06-16 ENCOUNTER — Observation Stay (HOSPITAL_COMMUNITY): Payer: Medicare Other

## 2017-06-16 DIAGNOSIS — I517 Cardiomegaly: Secondary | ICD-10-CM | POA: Diagnosis present

## 2017-06-16 DIAGNOSIS — G9340 Encephalopathy, unspecified: Principal | ICD-10-CM

## 2017-06-16 DIAGNOSIS — R001 Bradycardia, unspecified: Secondary | ICD-10-CM | POA: Diagnosis present

## 2017-06-16 DIAGNOSIS — R296 Repeated falls: Secondary | ICD-10-CM | POA: Diagnosis present

## 2017-06-16 DIAGNOSIS — Z9181 History of falling: Secondary | ICD-10-CM | POA: Diagnosis not present

## 2017-06-16 DIAGNOSIS — Z7901 Long term (current) use of anticoagulants: Secondary | ICD-10-CM | POA: Diagnosis not present

## 2017-06-16 DIAGNOSIS — R569 Unspecified convulsions: Secondary | ICD-10-CM | POA: Diagnosis present

## 2017-06-16 DIAGNOSIS — R9401 Abnormal electroencephalogram [EEG]: Secondary | ICD-10-CM | POA: Diagnosis present

## 2017-06-16 DIAGNOSIS — R946 Abnormal results of thyroid function studies: Secondary | ICD-10-CM | POA: Diagnosis present

## 2017-06-16 DIAGNOSIS — I69398 Other sequelae of cerebral infarction: Secondary | ICD-10-CM | POA: Diagnosis not present

## 2017-06-16 DIAGNOSIS — D6861 Antiphospholipid syndrome: Secondary | ICD-10-CM | POA: Diagnosis not present

## 2017-06-16 DIAGNOSIS — G40209 Localization-related (focal) (partial) symptomatic epilepsy and epileptic syndromes with complex partial seizures, not intractable, without status epilepticus: Secondary | ICD-10-CM | POA: Diagnosis not present

## 2017-06-16 DIAGNOSIS — M40202 Unspecified kyphosis, cervical region: Secondary | ICD-10-CM | POA: Diagnosis present

## 2017-06-16 DIAGNOSIS — R600 Localized edema: Secondary | ICD-10-CM | POA: Diagnosis present

## 2017-06-16 DIAGNOSIS — R4182 Altered mental status, unspecified: Secondary | ICD-10-CM | POA: Diagnosis not present

## 2017-06-16 DIAGNOSIS — R531 Weakness: Secondary | ICD-10-CM | POA: Diagnosis present

## 2017-06-16 LAB — CBC
HCT: 34.7 % — ABNORMAL LOW (ref 39.0–52.0)
Hemoglobin: 10.7 g/dL — ABNORMAL LOW (ref 13.0–17.0)
MCH: 28.1 pg (ref 26.0–34.0)
MCHC: 30.8 g/dL (ref 30.0–36.0)
MCV: 91.1 fL (ref 78.0–100.0)
PLATELETS: 138 10*3/uL — AB (ref 150–400)
RBC: 3.81 MIL/uL — AB (ref 4.22–5.81)
RDW: 15.1 % (ref 11.5–15.5)
WBC: 2.4 10*3/uL — ABNORMAL LOW (ref 4.0–10.5)

## 2017-06-16 LAB — COMPREHENSIVE METABOLIC PANEL
ALK PHOS: 84 U/L (ref 38–126)
ALT: 40 U/L (ref 17–63)
AST: 65 U/L — AB (ref 15–41)
Albumin: 3.1 g/dL — ABNORMAL LOW (ref 3.5–5.0)
Anion gap: 9 (ref 5–15)
BUN: 12 mg/dL (ref 6–20)
CALCIUM: 9.2 mg/dL (ref 8.9–10.3)
CHLORIDE: 106 mmol/L (ref 101–111)
CO2: 23 mmol/L (ref 22–32)
CREATININE: 1.02 mg/dL (ref 0.61–1.24)
GFR calc non Af Amer: >60 mL/min (ref >60)
Glucose, Bld: 79 mg/dL (ref 65–99)
Potassium: 3.9 mmol/L (ref 3.5–5.1)
Sodium: 138 mmol/L (ref 135–145)
Total Bilirubin: 0.9 mg/dL (ref 0.3–1.2)
Total Protein: 7.1 g/dL (ref 6.5–8.1)

## 2017-06-16 LAB — TROPONIN I
Troponin I: 0.03 ng/mL (ref <0.03)
Troponin I: <0.03 ng/mL (ref <0.03)

## 2017-06-16 LAB — PROTIME-INR
INR: 3.98
Prothrombin Time: 38.6 seconds — ABNORMAL HIGH (ref 11.4–15.2)

## 2017-06-16 LAB — MRSA PCR SCREENING: MRSA BY PCR: NEGATIVE

## 2017-06-16 LAB — CORTISOL: Cortisol, Plasma: 9 ug/dL

## 2017-06-16 LAB — TSH: TSH: 8.369 u[IU]/mL — ABNORMAL HIGH (ref 0.350–4.500)

## 2017-06-16 MED ORDER — SODIUM CHLORIDE 0.45 % IV SOLN
INTRAVENOUS | Status: DC
  Administered 2017-06-16 – 2017-06-18 (×4): via INTRAVENOUS

## 2017-06-16 MED ORDER — BETAINE (TRIMETHYLGLYCINE) 500 MG PO CAPS: ORAL_CAPSULE | Freq: Every day | ORAL | Status: DC

## 2017-06-16 MED ORDER — VITAMIN D 1000 UNITS PO TABS
1000.0000 [IU] | ORAL_TABLET | Freq: Every day | ORAL | Status: DC
  Administered 2017-06-16 – 2017-06-18 (×3): 1000 [IU] via ORAL
  Filled 2017-06-16 (×3): qty 1

## 2017-06-16 MED ORDER — CALCIUM CARBONATE 1250 (500 CA) MG PO TABS
1250.0000 mg | ORAL_TABLET | Freq: Every day | ORAL | Status: DC
  Administered 2017-06-16 – 2017-06-18 (×3): 1250 mg via ORAL
  Filled 2017-06-16 (×3): qty 1

## 2017-06-16 MED ORDER — VITAMIN C 500 MG PO TABS
1000.0000 mg | ORAL_TABLET | Freq: Every day | ORAL | Status: DC
  Administered 2017-06-16 – 2017-06-18 (×3): 1000 mg via ORAL
  Filled 2017-06-16 (×3): qty 2

## 2017-06-16 MED ORDER — RA GLUCOSAMINE-CHONDROIT-MSM PO TABS: 1.0000 | ORAL_TABLET | Freq: Two times a day (BID) | ORAL | Status: DC

## 2017-06-16 MED ORDER — VITAMIN B-12 1000 MCG PO TABS
1000.0000 ug | ORAL_TABLET | Freq: Every day | ORAL | Status: DC
  Administered 2017-06-16 – 2017-06-18 (×3): 1000 ug via ORAL
  Filled 2017-06-16 (×3): qty 1

## 2017-06-16 MED ORDER — MAGNESIUM OXIDE 250 MG PO TABS: 250.0000 mg | ORAL_TABLET | Freq: Every day | ORAL | Status: DC

## 2017-06-16 MED ORDER — VITAMIN B-6 50 MG PO TABS
50.0000 mg | ORAL_TABLET | Freq: Every day | ORAL | Status: DC
  Administered 2017-06-16 – 2017-06-18 (×3): 50 mg via ORAL
  Filled 2017-06-16 (×3): qty 1

## 2017-06-16 MED ORDER — ZINC SULFATE 220 (50 ZN) MG PO CAPS
220.0000 mg | ORAL_CAPSULE | Freq: Every day | ORAL | Status: DC
  Administered 2017-06-16 – 2017-06-18 (×3): 220 mg via ORAL
  Filled 2017-06-16 (×3): qty 1

## 2017-06-16 MED ORDER — ZINC 50 MG PO CAPS: 1.0000 | ORAL_CAPSULE | Freq: Every day | ORAL | Status: DC

## 2017-06-16 MED ORDER — NIACIN 100 MG PO TABS
50.0000 mg | ORAL_TABLET | Freq: Every day | ORAL | Status: DC
  Administered 2017-06-16 – 2017-06-18 (×3): 50 mg via ORAL
  Filled 2017-06-16 (×3): qty 1

## 2017-06-16 MED ORDER — VITAMIN E 180 MG (400 UNIT) PO CAPS
400.0000 [IU] | ORAL_CAPSULE | Freq: Every day | ORAL | Status: DC
  Administered 2017-06-16 – 2017-06-18 (×3): 400 [IU] via ORAL
  Filled 2017-06-16 (×3): qty 1

## 2017-06-16 MED ORDER — MAGNESIUM OXIDE 400 (241.3 MG) MG PO TABS
400.0000 mg | ORAL_TABLET | Freq: Every day | ORAL | Status: DC
  Administered 2017-06-16 – 2017-06-18 (×3): 400 mg via ORAL
  Filled 2017-06-16 (×3): qty 1

## 2017-06-16 NOTE — Progress Notes (Signed)
PROGRESS NOTE  [name] UVO:[id number] DOB: 1940-06-12 DOA: 06/15/2017 PCP: Dorothyann Peng, NP   LOS: 0 days   Brief Narrative / Interim history: 77 y.o. male with medical history significant of antiphospholipid syndrome with residual weakness due to stroke in 2000 as well as complex partial seizures on Keppra who was brought in by family secondary to recurrent altered mental status. He initially was complaining of abnormal sensation in both extremities. Patient was having some electric type sensation all over but most especially in the lower extremities. In the ER however he became more drowsy and confused. Patient goes in and out of confusion.  Neurology was consulted on admission  Assessment & Plan: Principal Problem:   Encephalopathy acute Active Problems:   Antiphospholipid syndrome (HCC)   Complex partial seizure (HCC)   Chronic anticoagulation   Encephalopathy   Acute encephalopathy -Unclear etiology, appreciate neurology consultation, patient underwent an MRI which was negative -He remains confused, on my evaluation today he thinks he is in [place], however is alert to time and person -TSH mildly elevated at 6.2, not in the range that would explain his encephalopathy -May need an LP per neurology  Prior seizures -Currently on Keppra, EEG without any epileptiform discharges, it did show mild diffuse slowing of the waking background  Antiphospholipid syndrome -On Coumadin, INR 3.98, hold Coumadin today  Prior CVA -History of left parieto-occipital stroke in 2000, no residual weakness, on Coumadin  Neck extensor weakness -Currently being evaluated by neurology for myasthenia gravis versus Lambert-Eaton syndrome, ACH receptor binding, blocking and modulating antibodies as well as voltage-gated calcium channel antibodies were ordered  Lower extremity swelling /cardiomegaly on chest x-ray mild central vascular congestion -Obtain 2D echo -No known prior cardiac  disease   DVT prophylaxis: Coumadin Code Status: Full code Family Communication: no family at bedside  Disposition Plan: home when ready   Consultants:   Neurology   Procedures:   None   Antimicrobials:  None    Subjective: -No complaints,  Objective: Vitals:   06/16/17 1308 06/16/17 1330 06/16/17 1424 06/16/17 1430  BP:  137/60  117/63  Pulse: (!) 53 61 (!) 50 (!) 56  Resp: 10 12  14   Temp:      TempSrc:      SpO2: 99% 96% 100% 99%    Intake/Output Summary (Last 24 hours) at 06/16/2017 1517 Last data filed at 06/15/2017 1734 Gross per 24 hour  Intake -  Output 1000 ml  Net -1000 ml   There were no vitals filed for this visit.  Examination:  Constitutional: NAD Eyes: lids and conjunctivae normal ENMT: Mucous membranes are moist.  Respiratory: clear to auscultation bilaterally, no wheezing, no crackles.  Cardiovascular: Regular rate and rhythm, no murmurs / rubs / gallops. 1+ pitting LE edema. 2+ pedal pulses.  Abdomen: no tenderness. Bowel sounds positive.  Skin: no rashes, lesions, ulcers. No induration Neurologic: non focal    Data Reviewed: I have independently reviewed following labs and imaging studies  CBC: Recent Labs  Lab 06/15/17 1336  WBC 2.9*  HGB 10.3*  HCT 33.0*  MCV 90.2  PLT 742*   Basic Metabolic Panel: Recent Labs  Lab 06/15/17 1336 06/15/17 1601  NA 133*  --   K 4.3  --   CL 101  --   CO2 23  --   GLUCOSE 126*  --   BUN 16  --   CREATININE 0.86  --   CALCIUM 9.1  --   MG  --  1.9   GFR: CrCl cannot be calculated (Unknown ideal weight.). Liver Function Tests: Recent Labs  Lab 06/15/17 1600  AST 73*  ALT 46  ALKPHOS 90  BILITOT 1.1  PROT 7.6  ALBUMIN 3.4*   No results for input(s): LIPASE, AMYLASE in the last 168 hours. Recent Labs  Lab 06/15/17 1600  AMMONIA 23   Coagulation Profile: Recent Labs  Lab 06/15/17 1600 06/16/17 1149  INR 2.85 3.98   Cardiac Enzymes: Recent Labs  Lab 06/15/17 1600  06/15/17 2231 06/16/17 0400  TROPONINI <0.03 0.03* 0.03*   BNP (last 3 results) No results for input(s): PROBNP in the last 8760 hours. HbA1C: No results for input(s): HGBA1C in the last 72 hours. CBG: Recent Labs  Lab 06/15/17 1601 06/15/17 2056  GLUCAP 114* 101*   Lipid Profile: No results for input(s): CHOL, HDL, LDLCALC, TRIG, CHOLHDL, LDLDIRECT in the last 72 hours. Thyroid Function Tests: Recent Labs    06/15/17 2231  TSH 6.256*   Anemia Panel: Recent Labs    06/15/17 2231  VITAMINB12 713   Urine analysis:    Component Value Date/Time   COLORURINE STRAW (A) 06/15/2017 1720   APPEARANCEUR CLEAR 06/15/2017 1720   LABSPEC 1.005 06/15/2017 1720   PHURINE 7.0 06/15/2017 1720   GLUCOSEU NEGATIVE 06/15/2017 1720   HGBUR NEGATIVE 06/15/2017 1720   BILIRUBINUR NEGATIVE 06/15/2017 1720   KETONESUR NEGATIVE 06/15/2017 1720   PROTEINUR NEGATIVE 06/15/2017 1720   NITRITE NEGATIVE 06/15/2017 1720   LEUKOCYTESUR NEGATIVE 06/15/2017 1720   Sepsis Labs: Invalid input(s): PROCALCITONIN, LACTICIDVEN  No results found for this or any previous visit (from the past 240 hour(s)).    Radiology Studies: Ct Angio Head W Or Wo Contrast  Result Date: 06/15/2017 CLINICAL DATA:  Bilateral lower extremity weakness EXAM: CT ANGIOGRAPHY HEAD AND NECK TECHNIQUE: Multidetector CT imaging of the head and neck was performed using the standard protocol during bolus administration of intravenous contrast. Multiplanar CT image reconstructions and MIPs were obtained to evaluate the vascular anatomy. Carotid stenosis measurements (when applicable) are obtained utilizing NASCET criteria, using the distal internal carotid diameter as the denominator. CONTRAST:  62mL ISOVUE-370 IOPAMIDOL (ISOVUE-370) INJECTION 76% COMPARISON:  Brain MRI and head CT 06/15/2017 FINDINGS: CTA NECK FINDINGS Aortic arch: There is no calcific atherosclerosis of the aortic arch. There is no aneurysm, dissection or  hemodynamically significant stenosis of the visualized ascending aorta and aortic arch. There is variant aortic arch anatomy. The brachiocephalic and left common carotid arteries share a common origin. The left vertebral artery arises independently from the aortic arch. The visualized proximal subclavian arteries are normal. Right carotid system: The right common carotid origin is widely patent. There is no common carotid or internal carotid artery dissection or aneurysm. No hemodynamically significant stenosis. Left carotid system: The left common carotid origin is widely patent. There is no common carotid or internal carotid artery dissection or aneurysm. No hemodynamically significant stenosis. Vertebral arteries: The vertebral system is left dominant. Both vertebral artery origins are normal. Both vertebral arteries are normal to their confluence with the basilar artery. Skeleton: There is no bony spinal canal stenosis. No lytic or blastic lesions. Other neck: The nasopharynx is clear. The oropharynx and hypopharynx are normal. The epiglottis is normal. The supraglottic larynx, glottis and subglottic larynx are normal. No retropharyngeal collection. The parapharyngeal spaces are preserved. The parotid and submandibular glands are normal. No sialolithiasis or salivary ductal dilatation. The thyroid gland is normal. There is no cervical lymphadenopathy. Upper chest: No pneumothorax  or pleural effusion. No nodules or masses. Review of the MIP images confirms the above findings CTA HEAD FINDINGS Anterior circulation: --Intracranial internal carotid arteries: Normal. --Anterior cerebral arteries: Normal. --Middle cerebral arteries: There is moderate narrowing of the proximal M 1 segment of the left middle cerebral artery. The distal branches of the left MCA distribution are attenuated compared to the right. The right MCA is normal. --Posterior communicating arteries: Present on the left, absent on the right.  Posterior circulation: --Posterior cerebral arteries: Normal. --Superior cerebellar arteries: Normal. --Basilar artery: Normal. --Anterior inferior cerebellar arteries: Normal. --Posterior inferior cerebellar arteries: Normal. Venous sinuses: As permitted by contrast timing, patent. Anatomic variants: Fetal origin of the left posterior cerebral artery. Delayed phase: No parenchymal contrast enhancement. Unchanged appearance of old left parietal lobe infarct. Review of the MIP images confirms the above findings. IMPRESSION: 1. No emergent large vessel occlusion. 2. Moderate narrowing of the proximal M 1 segment of the left middle cerebral artery with attenuated appearance of the distal branches. This is probably chronic, given the old left posterior MCA distribution infarct. 3. No hemodynamically significant stenosis or other abnormality of the carotid or vertebral arterial systems. Electronically Signed   By: Ulyses Jarred M.D.   On: 06/15/2017 22:46   Dg Chest 2 View  Result Date: 06/15/2017 CLINICAL DATA:  Peripheral edema. EXAM: CHEST  2 VIEW COMPARISON:  Radiograph 10/13/2007 FINDINGS: Borderline cardiomegaly. Mild central vascular congestion without pulmonary edema. Streaky bibasilar atelectasis. No pleural fluid or focal consolidation. No pneumothorax. No acute osseous abnormalities are seen. IMPRESSION: Borderline cardiomegaly and mild central vascular congestion. Streaky bibasilar atelectasis. Electronically Signed   By: Jeb Levering M.D.   On: 06/15/2017 22:35   Ct Head Wo Contrast  Result Date: 06/15/2017 CLINICAL DATA:  Altered mental status, nonresponsive EXAM: CT HEAD WITHOUT CONTRAST TECHNIQUE: Contiguous axial images were obtained from the base of the skull through the vertex without intravenous contrast. COMPARISON:  Oct 13, 2007 FINDINGS: Brain: Mild diffuse atrophy is stable. There is a cavum septum pellucidum and cavum vergae, an anatomic variant. There is no intracranial mass,  hemorrhage, extra-axial fluid collection, or midline shift. There is a stable prior infarct at the left temporoparietal junction, stable. Elsewhere, there is slight small vessel disease in the centra semiovale bilaterally. No new gray-white compartment lesions evident. No acute infarct appreciable. There are calcifications in the basal ganglia region, stable and felt to be physiologic. Vascular: There is no appreciable hyperdense vessel. There is calcification in each carotid siphon. Skull: Bony calvarium appears intact. Sinuses/Orbits: There is mucosal thickening and opacification involving multiple ethmoid air cells. Other visualized paranasal sinuses are clear. Orbits appear symmetric bilaterally. Other: Mastoids on the right are clear. There is extensive mastoid disease on the left, also present on prior study. There is debris in each external auditory canal. IMPRESSION: 1. Mild atrophy with prior left temporal-parietal junction infarct, stable. There is slight periventricular small vessel disease. No acute infarct evident. No mass or hemorrhage. 2.  There are foci of arterial vascular calcification. 3.  Ethmoid sinus disease bilaterally. 4. Chronic mastoid disease on the left. Probable cerumen in each external auditory canal. Electronically Signed   By: Lowella Grip III M.D.   On: 06/15/2017 16:30   Ct Angio Neck W And/or Wo Contrast  Result Date: 06/15/2017 CLINICAL DATA:  Bilateral lower extremity weakness EXAM: CT ANGIOGRAPHY HEAD AND NECK TECHNIQUE: Multidetector CT imaging of the head and neck was performed using the standard protocol during bolus administration of intravenous  contrast. Multiplanar CT image reconstructions and MIPs were obtained to evaluate the vascular anatomy. Carotid stenosis measurements (when applicable) are obtained utilizing NASCET criteria, using the distal internal carotid diameter as the denominator. CONTRAST:  27mL ISOVUE-370 IOPAMIDOL (ISOVUE-370) INJECTION 76%  COMPARISON:  Brain MRI and head CT 06/15/2017 FINDINGS: CTA NECK FINDINGS Aortic arch: There is no calcific atherosclerosis of the aortic arch. There is no aneurysm, dissection or hemodynamically significant stenosis of the visualized ascending aorta and aortic arch. There is variant aortic arch anatomy. The brachiocephalic and left common carotid arteries share a common origin. The left vertebral artery arises independently from the aortic arch. The visualized proximal subclavian arteries are normal. Right carotid system: The right common carotid origin is widely patent. There is no common carotid or internal carotid artery dissection or aneurysm. No hemodynamically significant stenosis. Left carotid system: The left common carotid origin is widely patent. There is no common carotid or internal carotid artery dissection or aneurysm. No hemodynamically significant stenosis. Vertebral arteries: The vertebral system is left dominant. Both vertebral artery origins are normal. Both vertebral arteries are normal to their confluence with the basilar artery. Skeleton: There is no bony spinal canal stenosis. No lytic or blastic lesions. Other neck: The nasopharynx is clear. The oropharynx and hypopharynx are normal. The epiglottis is normal. The supraglottic larynx, glottis and subglottic larynx are normal. No retropharyngeal collection. The parapharyngeal spaces are preserved. The parotid and submandibular glands are normal. No sialolithiasis or salivary ductal dilatation. The thyroid gland is normal. There is no cervical lymphadenopathy. Upper chest: No pneumothorax or pleural effusion. No nodules or masses. Review of the MIP images confirms the above findings CTA HEAD FINDINGS Anterior circulation: --Intracranial internal carotid arteries: Normal. --Anterior cerebral arteries: Normal. --Middle cerebral arteries: There is moderate narrowing of the proximal M 1 segment of the left middle cerebral artery. The distal  branches of the left MCA distribution are attenuated compared to the right. The right MCA is normal. --Posterior communicating arteries: Present on the left, absent on the right. Posterior circulation: --Posterior cerebral arteries: Normal. --Superior cerebellar arteries: Normal. --Basilar artery: Normal. --Anterior inferior cerebellar arteries: Normal. --Posterior inferior cerebellar arteries: Normal. Venous sinuses: As permitted by contrast timing, patent. Anatomic variants: Fetal origin of the left posterior cerebral artery. Delayed phase: No parenchymal contrast enhancement. Unchanged appearance of old left parietal lobe infarct. Review of the MIP images confirms the above findings. IMPRESSION: 1. No emergent large vessel occlusion. 2. Moderate narrowing of the proximal M 1 segment of the left middle cerebral artery with attenuated appearance of the distal branches. This is probably chronic, given the old left posterior MCA distribution infarct. 3. No hemodynamically significant stenosis or other abnormality of the carotid or vertebral arterial systems. Electronically Signed   By: Ulyses Jarred M.D.   On: 06/15/2017 22:46   Mr Brain Wo Contrast  Result Date: 06/15/2017 CLINICAL DATA:  Acute dysesthesias. EXAM: MRI HEAD WITHOUT CONTRAST TECHNIQUE: Multiplanar, multiecho pulse sequences of the brain and surrounding structures were obtained without intravenous contrast. COMPARISON:  CT same day FINDINGS: The study suffers from motion degradation. Brain: Diffusion imaging does not show any acute or subacute infarction. Brainstem and cerebellum are unremarkable. Cerebral hemispheres show an old infarction in the left parietal lobe which has progressed to atrophy, encephalomalacia and gliosis. There are mild chronic small-vessel changes elsewhere in the deep white matter. No mass lesion, hemorrhage, hydrocephalus or extra-axial collection. Vascular: Major vessels at the base of the brain show flow. Skull and upper  cervical spine: Negative Sinuses/Orbits: Left maxillary sinusitis. Left mastoid effusion. Orbits negative. Other: None IMPRESSION: No acute finding. Motion degraded exam. Old left parietal cortical and subcortical infarction. Left maxillary sinus mucosal inflammatory disease. Left mastoid effusion. Electronically Signed   By: Nelson Chimes M.D.   On: 06/15/2017 19:41   Mr Cervical Spine Wo Contrast  Result Date: 06/15/2017 CLINICAL DATA:  Acute dysesthesias. EXAM: MRI CERVICAL SPINE WITHOUT CONTRAST TECHNIQUE: Multiplanar, multisequence MR imaging of the cervical spine was performed. No intravenous contrast was administered. COMPARISON:  None. FINDINGS: Significantly motion degraded exam. Alignment: Kyphotic curvature throughout the cervical region. Vertebrae: No evidence of fracture or focal bone lesion. Cord: No evidence of cord compression or primary cord lesion. Posterior Fossa, vertebral arteries, paraspinal tissues: Negative Disc levels: Disc degeneration throughout the cervical region with desiccation and loss of height. At no level is there appear to be a disc herniation. No compressive stenosis of the canal. There is foraminal stenosis bilaterally at C5-6, C6-7 and C7-T1 which could possibly cause neural compression. IMPRESSION: Limited information based on motion degradation throughout the exam. Kyphotic curvature of the thoracic spine. Diffuse cervical spondylosis. No compressive central canal stenosis. Bilateral foraminal narrowing at C5-6, C6-7 and C7-T1 which could possibly be associated with neural compression. Electronically Signed   By: Nelson Chimes M.D.   On: 06/15/2017 19:38    Scheduled Meds: Continuous Infusions: . levETIRAcetam Stopped (06/16/17 0900)    Marzetta Board, MD, PhD [facility] Pager [phone]  If 7PM-7AM, please contact night-coverage [email or web address] Password [email or web address] 06/16/2017, 3:17 PM

## 2017-06-16 NOTE — ED Notes (Signed)
Son is in this morning for an update. Work number is [phone] and will have to be paged.

## 2017-06-16 NOTE — Procedures (Signed)
ELECTROENCEPHALOGRAM REPORT  Date of Study: 06/16/2017  Patient's Name: [name] MRN: [id number] Date of Birth: 1940-12-27  Referring Provider: Amie Portland, MD  Clinical History: 77 y.o. male with medical history significant of antiphospholipid syndrome with residual weakness due to stroke in 2000 as well as complex partial seizures on Keppra who was brought in by family secondary to recurrent altered mental status  Medications: levetiracetam  Technical Summary: A multichannel digital EEG recording measured by the international 10-20 system with electrodes applied with paste and impedances below 5000 ohms performed as portable with EKG monitoring in an awake and asleep patient.  Hyperventilation and photic stimulation were not performed.  The digital EEG was referentially recorded, reformatted, and digitally filtered in a variety of bipolar and referential montages for optimal display.   Description: The patient is awake and asleep during the recording.  During maximal wakefulness, the EEG is compromised by significant muscle artifact.  In the most readable portions, there is a symmetric, medium voltage 6-7 Hz posterior dominant rhythm that attenuates with eye opening. This is admixed with diffuse 4-5 Hz theta slowing of the waking background.  During drowsiness and sleep, there is an increase in theta slowing of the background.  Vertex waves and symmetric sleep spindles were seen.  There were no epileptiform discharges or electrographic seizures seen.    EKG lead was unremarkable.  Impression: This awake and asleep EEG is abnormal due to mild diffuse slowing of the waking background.  Clinical Correlation of the above findings indicates diffuse cerebral dysfunction that is non-specific in etiology and can be seen with hypoxic/ischemic injury, toxic/metabolic encephalopathies, neurodegenerative disorders, or medication effect.  The absence of epileptiform discharges does not rule  out a clinical diagnosis of epilepsy.  Clinical correlation is advised.   Metta Clines, DO

## 2017-06-16 NOTE — ED Notes (Signed)
Pt's son went home, left contact information: [place] - [phone].  Will return in the morning.

## 2017-06-16 NOTE — ED Notes (Signed)
Dinner tray at bedside

## 2017-06-16 NOTE — Care Management Obs Status (Signed)
[place] NOTIFICATION   Patient Details  Name: [name] MRN: [id number] Date of Birth: Mar 10, 1941   Medicare Observation Status Notification Given:  Yes    Carles Collet, RN 06/16/2017, 3:18 PM

## 2017-06-16 NOTE — Progress Notes (Signed)
ANTICOAGULATION CONSULT NOTE - Initial Consult  Pharmacy Consult for warfarin Indication: stroke and antiphospholipid syndrome  Allergies  Allergen Reactions  . Aspirin     Other reaction(s): Other (See Comments) Other Reaction: GI Upset    Patient Measurements:    Vital Signs: BP: 112/64 (01/09 1100) Pulse Rate: 67 (01/09 1100)  Labs: Recent Labs    06/15/17 1336 06/15/17 1600 06/15/17 2231 06/16/17 0400 06/16/17 1149  HGB 10.3*  --   --   --   --   HCT 33.0*  --   --   --   --   PLT 148*  --   --   --   --   LABPROT  --  29.7*  --   --  38.6*  INR  --  2.85  --   --  3.98  CREATININE 0.86  --   --   --   --   TROPONINI  --  <0.03 0.03* 0.03*  --     CrCl cannot be calculated (Unknown ideal weight.).   Medical History: Past Medical History:  Diagnosis Date  . Antiphospholipid syndrome ([place]) 05/06/2012   Overview:    A. Left MCA stroke, Mar 2000   B. Recurrent MCA stroke while subtherapeutic on warfarin, 2000   C. Positive antiphospholipid antibody testing  . Chicken pox   . Chronic anticoagulation 05/06/2012  . Complex partial seizure ([place]) 04/12/2015  . Dropped head syndrome   . 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 Kyphosis   . Stroke [facility]) 2000    Assessment: 80 yom presented to the ED with weakness and AMS. He is on chronic coumadin for history of stroke and antiphospholipid syndrome. INR is supratherapeutic today at 3.98. H/H and platelets are slightly low but no bleeding noted.  Goal of Therapy:  INR 2.5-3.5 Monitor platelets by anticoagulation protocol: Yes   Plan:  No warfarin today Daily INR  Arnett Galindez, Rande Lawman 06/16/2017,12:35 PM

## 2017-06-16 NOTE — ED Notes (Signed)
Pt ambulated to restroom with walker, pt stated he does not use any assistive devices to ambulate at home but felt unsteady on his feet without walker.

## 2017-06-16 NOTE — Progress Notes (Signed)
EEG complete - results pending 

## 2017-06-16 NOTE — ED Notes (Signed)
Per pts sister, pt lives in [place] by himself and neuro baseline A&O x4, completes ADLs on his own, the last time family spoke to the pt was Sunday, per the pts sister the neighbor brought him in, pts sister was notified he was at the hospital by the nephew (the pts son), will continue to monitor

## 2017-06-16 NOTE — ED Notes (Signed)
Pt reports he is still hungry after finishing dinner tray, pt provided sandwich and graham crackers. Pt requesting that his walker be placed nearby his bed in case he needed to get up to the restroom, this RN advised patient we would like him to use his call light if he needs to get up so that someone can assist him to the restroom, pt verbalized understanding.

## 2017-06-16 NOTE — ED Notes (Signed)
Pt out for EEC procedure.

## 2017-06-16 NOTE — ED Notes (Signed)
Family at bedside. 

## 2017-06-16 NOTE — Progress Notes (Signed)
Subjective: Patient still appears mildly confused. He describes administrators coming and speaking to the secretary, but is unable to give any specifics.   Exam: Vitals:   06/16/17 1424 06/16/17 1430  BP:  117/63  Pulse: (!) 50 (!) 56  Resp:  14  Temp:    SpO2: 100% 99%   Gen: In bed, NAD Resp: non-labored breathing, no acute distress Abd: soft, nt  Neuro: MS: Awake, alert, knows the year is 2019, gives month as february CN: VFF, EOMI Motor: MAEW Sensory:intact to LT  Pertinent Labs: B12 713 TSH WNL Ammonia WNL INR is supratheraputic.   Impression: 77 yo M with delirium of unclear wtiology. EEG without evidence of ongoing seizure, MRI without evidence of stroke. LP would typically be indicated with the exception of a supratheraputic INR.  My index of suspicion for infection is low, however, and I do nto think empiric antibiotics are indicated at this time. Keppra can be deiriogenic, may consider changing to depakote.   Recommendations: 1) cortisol 2) will continue to follow.   Roland Rack, MD [facility] [phone]  If 7pm- 7am, please page neurology on call as listed in [place].

## 2017-06-17 ENCOUNTER — Inpatient Hospital Stay (HOSPITAL_COMMUNITY): Payer: Medicare Other

## 2017-06-17 ENCOUNTER — Other Ambulatory Visit: Payer: Self-pay

## 2017-06-17 LAB — CBC
HEMATOCRIT: 32.9 % — AB (ref 39.0–52.0)
HEMOGLOBIN: 10.5 g/dL — AB (ref 13.0–17.0)
MCH: 29.2 pg (ref 26.0–34.0)
MCHC: 31.9 g/dL (ref 30.0–36.0)
MCV: 91.4 fL (ref 78.0–100.0)
Platelets: 138 10*3/uL — ABNORMAL LOW (ref 150–400)
RBC: 3.6 MIL/uL — ABNORMAL LOW (ref 4.22–5.81)
RDW: 15.5 % (ref 11.5–15.5)
WBC: 2.2 10*3/uL — AB (ref 4.0–10.5)

## 2017-06-17 LAB — MISC LABCORP TEST (SEND OUT): Labcorp test code: 140640

## 2017-06-17 LAB — COMPREHENSIVE METABOLIC PANEL
ALBUMIN: 2.8 g/dL — AB (ref 3.5–5.0)
ALT: 36 U/L (ref 17–63)
ANION GAP: 7 (ref 5–15)
AST: 55 U/L — ABNORMAL HIGH (ref 15–41)
Alkaline Phosphatase: 78 U/L (ref 38–126)
BILIRUBIN TOTAL: 0.9 mg/dL (ref 0.3–1.2)
BUN: 14 mg/dL (ref 6–20)
CO2: 25 mmol/L (ref 22–32)
Calcium: 8.9 mg/dL (ref 8.9–10.3)
Chloride: 106 mmol/L (ref 101–111)
Creatinine, Ser: 1.06 mg/dL (ref 0.61–1.24)
GFR calc Af Amer: >60 mL/min (ref >60)
GFR calc non Af Amer: >60 mL/min (ref >60)
GLUCOSE: 68 mg/dL (ref 65–99)
POTASSIUM: 4 mmol/L (ref 3.5–5.1)
Sodium: 138 mmol/L (ref 135–145)
TOTAL PROTEIN: 6.3 g/dL — AB (ref 6.5–8.1)

## 2017-06-17 LAB — TROPONIN I: Troponin I: <0.03 ng/mL (ref <0.03)

## 2017-06-17 LAB — PROTIME-INR
INR: 3.88
Prothrombin Time: 37.8 seconds — ABNORMAL HIGH (ref 11.4–15.2)

## 2017-06-17 LAB — ECHOCARDIOGRAM COMPLETE

## 2017-06-17 NOTE — Progress Notes (Signed)
  Echocardiogram 2D Echocardiogram has been performed.  Bobbye Charleston 06/17/2017, 2:35 PM

## 2017-06-17 NOTE — Progress Notes (Signed)
Subjective: Patient is markedly improved. States"I am doing much better"  Exam: Vitals:   06/17/17 0831 06/17/17 1202  BP: (!) 111/58 (!) 117/58  Pulse: 64 64  Resp: 12 14  Temp: 97.7 F (36.5 C) (!) 97.5 F (36.4 C)  SpO2: 98% 99%   Gen: In bed, NAD Resp: non-labored breathing, no acute distress Abd: soft, nt  Neuro: MS: Awake, alert, knows the year is 2019, gives month as February, but corrects himself to January, oriented to location. He is able to give me detailed history regarding financial transactions(delaying payments due to his hospitalization) CN: VFF, EOMI Motor: MAEW Sensory:intact to LT   Impression: 77 yo M with delirium of unclear wtiology. EEG without evidence of ongoing seizure, MRI without evidence of stroke. At this point, I am not sure what caused his delirium, but it seems to be clearing. Tried calling son for ancillary information, but no answer. With improvement, my suspicion is that further workup is unlikely to be helpful. If he were to reworsen, would consider continuous EEG to rule out intermittent seizures.   Recommendations: 1) will continue to follow.   Roland Rack, MD [facility] [phone]  If 7pm- 7am, please page neurology on call as listed in [place].

## 2017-06-17 NOTE — Care Management Note (Signed)
Case Management Note  Patient Details  Name: DARRAGH NAY MRN: [id number] Date of Birth: [birth year]  Subjective/Objective:  Pt admitted on 06/15/17 with AMS.  PTA, pt independent, lives at home alone.                   Action/Plan: Will follow for discharge planning as pt progresses.  Recommend PT/OT consults to determine home needs.    Expected Discharge Date:                  Expected Discharge Plan:  [place]  In-House Referral:     Discharge planning Services  CM Consult  Post Acute Care Choice:    Choice offered to:     DME Arranged:    DME Agency:     HH Arranged:    [place] Agency:     Status of Service:  In process, will continue to follow  If discussed at Long Length of Stay Meetings, dates discussed:    Additional Comments:  Reinaldo Raddle, RN, BSN  Trauma/Neuro ICU Case Manager [phone]

## 2017-06-17 NOTE — Progress Notes (Signed)
PROGRESS NOTE  [name] JJH:[id number] DOB: 1940-11-25 DOA: 06/15/2017 PCP: Dorothyann Peng, NP   LOS: 1 day   Brief Narrative / Interim history: 77 y.o. male with medical history significant of antiphospholipid syndrome with residual weakness due to stroke in 2000 as well as complex partial seizures on Keppra who was brought in by family secondary to recurrent altered mental status. He initially was complaining of abnormal sensation in both extremities. Patient was having some electric type sensation all over but most especially in the lower extremities. In the ER however he became more drowsy and confused. Patient goes in and out of confusion.  Neurology was consulted on admission  Assessment & Plan: Principal Problem:   Encephalopathy acute Active Problems:   Antiphospholipid syndrome (HCC)   Complex partial seizure (HCC)   Chronic anticoagulation   Encephalopathy   Acute encephalopathy /acute delirium of unknown etiology -Unclear etiology, appreciate neurology consultation, patient underwent an MRI which was negative, EEG negative -Today he is alert and oriented x4, his confusion seems to have resolved -TSH mildly elevated at 6.2, not in the range that would explain his encephalopathy -Discussed with Dr. Leonel Ramsay over the phone today  Prior seizures -Currently on Keppra, EEG without any epileptiform discharges, it did show mild diffuse slowing of the waking background -He is back to normal, his confusion returns will need continuous EEG  Antiphospholipid syndrome -On Coumadin, INR 3.98 -Coumadin per pharmacy  Prior CVA -History of left parieto-occipital stroke in 2000, no residual weakness, on Coumadin  Neck extensor weakness -Currently being evaluated by neurology for myasthenia gravis versus Lambert-Eaton syndrome, ACH receptor binding, blocking and modulating antibodies as well as voltage-gated calcium channel antibodies were ordered -Labs are pending  today  Lower extremity swelling /cardiomegaly on chest x-ray mild central vascular congestion -Obtain 2D echo, pending today -No known prior cardiac disease   DVT prophylaxis: Coumadin Code Status: Full code Family Communication: no family at bedside  Disposition Plan: home when ready   Consultants:   Neurology   Procedures:   None   Antimicrobials:  None    Subjective: -Alert and oriented x4, he remembers telling me yesterday thinking he is in [place].  No chest pain, shortness of breath, no palpitations.  No abdominal pain nausea or vomiting.  Objective: Vitals:   06/17/17 0041 06/17/17 0439 06/17/17 0831 06/17/17 1202  BP: 127/71 130/71 (!) 111/58 (!) 117/58  Pulse: (!) 53 (!) 55 64 64  Resp: (!) 21 11 12 14   Temp: (!) 97.3 F (36.3 C) (!) 97.5 F (36.4 C) 97.7 F (36.5 C) (!) 97.5 F (36.4 C)  TempSrc: Oral Oral Oral Oral  SpO2: 100% 100% 98% 99%    Intake/Output Summary (Last 24 hours) at 06/17/2017 1232 Last data filed at 06/17/2017 0945 Gross per 24 hour  Intake 1536.67 ml  Output 950 ml  Net 586.67 ml   There were no vitals filed for this visit.  Examination:  Constitutional: No distress, pleasant Eyes: No scleral icterus, lids and conjunctivae normal ENMT: Moist mucous membranes Respiratory: Clear to auscultation bilaterally, no wheezing, no crackles Cardiovascular: Regular rate and rhythm, no murmurs.  1+ pitting lower extremity edema.  2+ pulses Abdomen: Soft, nontender, bowel sounds positive.  Skin: No rashes noted, Neurologic: No focal findings   Data Reviewed: I have independently reviewed following labs and imaging studies  CBC: Recent Labs  Lab 06/15/17 1336 06/16/17 1553 06/17/17 0349  WBC 2.9* 2.4* 2.2*  HGB 10.3* 10.7* 10.5*  HCT 33.0*  34.7* 32.9*  MCV 90.2 91.1 91.4  PLT 148* 138* 952*   Basic Metabolic Panel: Recent Labs  Lab 06/15/17 1336 06/15/17 1601 06/16/17 1553 06/17/17 0349  NA 133*  --  138 138  K  4.3  --  3.9 4.0  CL 101  --  106 106  CO2 23  --  23 25  GLUCOSE 126*  --  79 68  BUN 16  --  12 14  CREATININE 0.86  --  1.02 1.06  CALCIUM 9.1  --  9.2 8.9  MG  --  1.9  --   --    GFR: CrCl cannot be calculated (Unknown ideal weight.). Liver Function Tests: Recent Labs  Lab 06/15/17 1600 06/16/17 1553 06/17/17 0349  AST 73* 65* 55*  ALT 46 40 36  ALKPHOS 90 84 78  BILITOT 1.1 0.9 0.9  PROT 7.6 7.1 6.3*  ALBUMIN 3.4* 3.1* 2.8*   No results for input(s): LIPASE, AMYLASE in the last 168 hours. Recent Labs  Lab 06/15/17 1600  AMMONIA 23   Coagulation Profile: Recent Labs  Lab 06/15/17 1600 06/16/17 1149 06/17/17 0349  INR 2.85 3.98 3.88   Cardiac Enzymes: Recent Labs  Lab 06/15/17 2231 06/16/17 0400 06/16/17 1553 06/16/17 2126 06/17/17 0349  TROPONINI 0.03* 0.03* <0.03 <0.03 <0.03   BNP (last 3 results) No results for input(s): PROBNP in the last 8760 hours. HbA1C: No results for input(s): HGBA1C in the last 72 hours. CBG: Recent Labs  Lab 06/15/17 1601 06/15/17 2056  GLUCAP 114* 101*   Lipid Profile: No results for input(s): CHOL, HDL, LDLCALC, TRIG, CHOLHDL, LDLDIRECT in the last 72 hours. Thyroid Function Tests: Recent Labs    06/16/17 1655  TSH 8.369*   Anemia Panel: Recent Labs    06/15/17 2231  VITAMINB12 713   Urine analysis:    Component Value Date/Time   COLORURINE STRAW (A) 06/15/2017 1720   APPEARANCEUR CLEAR 06/15/2017 1720   LABSPEC 1.005 06/15/2017 1720   PHURINE 7.0 06/15/2017 1720   GLUCOSEU NEGATIVE 06/15/2017 1720   HGBUR NEGATIVE 06/15/2017 1720   BILIRUBINUR NEGATIVE 06/15/2017 1720   KETONESUR NEGATIVE 06/15/2017 1720   PROTEINUR NEGATIVE 06/15/2017 1720   NITRITE NEGATIVE 06/15/2017 1720   LEUKOCYTESUR NEGATIVE 06/15/2017 1720   Sepsis Labs: Invalid input(s): PROCALCITONIN, LACTICIDVEN  Recent Results (from the past 240 hour(s))  MRSA PCR Screening     Status: None   Collection Time: 06/16/17  8:34 PM   Result Value Ref Range Status   MRSA by PCR NEGATIVE NEGATIVE Final    Comment:        The GeneXpert MRSA Assay (FDA approved for NASAL specimens only), is one component of a comprehensive MRSA colonization surveillance program. It is not intended to diagnose MRSA infection nor to guide or monitor treatment for MRSA infections.       Radiology Studies: Ct Angio Head W Or Wo Contrast  Result Date: 06/15/2017 CLINICAL DATA:  Bilateral lower extremity weakness EXAM: CT ANGIOGRAPHY HEAD AND NECK TECHNIQUE: Multidetector CT imaging of the head and neck was performed using the standard protocol during bolus administration of intravenous contrast. Multiplanar CT image reconstructions and MIPs were obtained to evaluate the vascular anatomy. Carotid stenosis measurements (when applicable) are obtained utilizing NASCET criteria, using the distal internal carotid diameter as the denominator. CONTRAST:  40mL ISOVUE-370 IOPAMIDOL (ISOVUE-370) INJECTION 76% COMPARISON:  Brain MRI and head CT 06/15/2017 FINDINGS: CTA NECK FINDINGS Aortic arch: There is no calcific atherosclerosis of the aortic  arch. There is no aneurysm, dissection or hemodynamically significant stenosis of the visualized ascending aorta and aortic arch. There is variant aortic arch anatomy. The brachiocephalic and left common carotid arteries share a common origin. The left vertebral artery arises independently from the aortic arch. The visualized proximal subclavian arteries are normal. Right carotid system: The right common carotid origin is widely patent. There is no common carotid or internal carotid artery dissection or aneurysm. No hemodynamically significant stenosis. Left carotid system: The left common carotid origin is widely patent. There is no common carotid or internal carotid artery dissection or aneurysm. No hemodynamically significant stenosis. Vertebral arteries: The vertebral system is left dominant. Both vertebral artery  origins are normal. Both vertebral arteries are normal to their confluence with the basilar artery. Skeleton: There is no bony spinal canal stenosis. No lytic or blastic lesions. Other neck: The nasopharynx is clear. The oropharynx and hypopharynx are normal. The epiglottis is normal. The supraglottic larynx, glottis and subglottic larynx are normal. No retropharyngeal collection. The parapharyngeal spaces are preserved. The parotid and submandibular glands are normal. No sialolithiasis or salivary ductal dilatation. The thyroid gland is normal. There is no cervical lymphadenopathy. Upper chest: No pneumothorax or pleural effusion. No nodules or masses. Review of the MIP images confirms the above findings CTA HEAD FINDINGS Anterior circulation: --Intracranial internal carotid arteries: Normal. --Anterior cerebral arteries: Normal. --Middle cerebral arteries: There is moderate narrowing of the proximal M 1 segment of the left middle cerebral artery. The distal branches of the left MCA distribution are attenuated compared to the right. The right MCA is normal. --Posterior communicating arteries: Present on the left, absent on the right. Posterior circulation: --Posterior cerebral arteries: Normal. --Superior cerebellar arteries: Normal. --Basilar artery: Normal. --Anterior inferior cerebellar arteries: Normal. --Posterior inferior cerebellar arteries: Normal. Venous sinuses: As permitted by contrast timing, patent. Anatomic variants: Fetal origin of the left posterior cerebral artery. Delayed phase: No parenchymal contrast enhancement. Unchanged appearance of old left parietal lobe infarct. Review of the MIP images confirms the above findings. IMPRESSION: 1. No emergent large vessel occlusion. 2. Moderate narrowing of the proximal M 1 segment of the left middle cerebral artery with attenuated appearance of the distal branches. This is probably chronic, given the old left posterior MCA distribution infarct. 3. No  hemodynamically significant stenosis or other abnormality of the carotid or vertebral arterial systems. Electronically Signed   By: Ulyses Jarred M.D.   On: 06/15/2017 22:46   Dg Chest 2 View  Result Date: 06/15/2017 CLINICAL DATA:  Peripheral edema. EXAM: CHEST  2 VIEW COMPARISON:  Radiograph 10/13/2007 FINDINGS: Borderline cardiomegaly. Mild central vascular congestion without pulmonary edema. Streaky bibasilar atelectasis. No pleural fluid or focal consolidation. No pneumothorax. No acute osseous abnormalities are seen. IMPRESSION: Borderline cardiomegaly and mild central vascular congestion. Streaky bibasilar atelectasis. Electronically Signed   By: Jeb Levering M.D.   On: 06/15/2017 22:35   Ct Head Wo Contrast  Result Date: 06/15/2017 CLINICAL DATA:  Altered mental status, nonresponsive EXAM: CT HEAD WITHOUT CONTRAST TECHNIQUE: Contiguous axial images were obtained from the base of the skull through the vertex without intravenous contrast. COMPARISON:  Oct 13, 2007 FINDINGS: Brain: Mild diffuse atrophy is stable. There is a cavum septum pellucidum and cavum vergae, an anatomic variant. There is no intracranial mass, hemorrhage, extra-axial fluid collection, or midline shift. There is a stable prior infarct at the left temporoparietal junction, stable. Elsewhere, there is slight small vessel disease in the centra semiovale bilaterally. No new gray-white compartment  lesions evident. No acute infarct appreciable. There are calcifications in the basal ganglia region, stable and felt to be physiologic. Vascular: There is no appreciable hyperdense vessel. There is calcification in each carotid siphon. Skull: Bony calvarium appears intact. Sinuses/Orbits: There is mucosal thickening and opacification involving multiple ethmoid air cells. Other visualized paranasal sinuses are clear. Orbits appear symmetric bilaterally. Other: Mastoids on the right are clear. There is extensive mastoid disease on the left,  also present on prior study. There is debris in each external auditory canal. IMPRESSION: 1. Mild atrophy with prior left temporal-parietal junction infarct, stable. There is slight periventricular small vessel disease. No acute infarct evident. No mass or hemorrhage. 2.  There are foci of arterial vascular calcification. 3.  Ethmoid sinus disease bilaterally. 4. Chronic mastoid disease on the left. Probable cerumen in each external auditory canal. Electronically Signed   By: Lowella Grip III M.D.   On: 06/15/2017 16:30   Ct Angio Neck W And/or Wo Contrast  Result Date: 06/15/2017 CLINICAL DATA:  Bilateral lower extremity weakness EXAM: CT ANGIOGRAPHY HEAD AND NECK TECHNIQUE: Multidetector CT imaging of the head and neck was performed using the standard protocol during bolus administration of intravenous contrast. Multiplanar CT image reconstructions and MIPs were obtained to evaluate the vascular anatomy. Carotid stenosis measurements (when applicable) are obtained utilizing NASCET criteria, using the distal internal carotid diameter as the denominator. CONTRAST:  81mL ISOVUE-370 IOPAMIDOL (ISOVUE-370) INJECTION 76% COMPARISON:  Brain MRI and head CT 06/15/2017 FINDINGS: CTA NECK FINDINGS Aortic arch: There is no calcific atherosclerosis of the aortic arch. There is no aneurysm, dissection or hemodynamically significant stenosis of the visualized ascending aorta and aortic arch. There is variant aortic arch anatomy. The brachiocephalic and left common carotid arteries share a common origin. The left vertebral artery arises independently from the aortic arch. The visualized proximal subclavian arteries are normal. Right carotid system: The right common carotid origin is widely patent. There is no common carotid or internal carotid artery dissection or aneurysm. No hemodynamically significant stenosis. Left carotid system: The left common carotid origin is widely patent. There is no common carotid or internal  carotid artery dissection or aneurysm. No hemodynamically significant stenosis. Vertebral arteries: The vertebral system is left dominant. Both vertebral artery origins are normal. Both vertebral arteries are normal to their confluence with the basilar artery. Skeleton: There is no bony spinal canal stenosis. No lytic or blastic lesions. Other neck: The nasopharynx is clear. The oropharynx and hypopharynx are normal. The epiglottis is normal. The supraglottic larynx, glottis and subglottic larynx are normal. No retropharyngeal collection. The parapharyngeal spaces are preserved. The parotid and submandibular glands are normal. No sialolithiasis or salivary ductal dilatation. The thyroid gland is normal. There is no cervical lymphadenopathy. Upper chest: No pneumothorax or pleural effusion. No nodules or masses. Review of the MIP images confirms the above findings CTA HEAD FINDINGS Anterior circulation: --Intracranial internal carotid arteries: Normal. --Anterior cerebral arteries: Normal. --Middle cerebral arteries: There is moderate narrowing of the proximal M 1 segment of the left middle cerebral artery. The distal branches of the left MCA distribution are attenuated compared to the right. The right MCA is normal. --Posterior communicating arteries: Present on the left, absent on the right. Posterior circulation: --Posterior cerebral arteries: Normal. --Superior cerebellar arteries: Normal. --Basilar artery: Normal. --Anterior inferior cerebellar arteries: Normal. --Posterior inferior cerebellar arteries: Normal. Venous sinuses: As permitted by contrast timing, patent. Anatomic variants: Fetal origin of the left posterior cerebral artery. Delayed phase: No parenchymal contrast  enhancement. Unchanged appearance of old left parietal lobe infarct. Review of the MIP images confirms the above findings. IMPRESSION: 1. No emergent large vessel occlusion. 2. Moderate narrowing of the proximal M 1 segment of the left  middle cerebral artery with attenuated appearance of the distal branches. This is probably chronic, given the old left posterior MCA distribution infarct. 3. No hemodynamically significant stenosis or other abnormality of the carotid or vertebral arterial systems. Electronically Signed   By: Ulyses Jarred M.D.   On: 06/15/2017 22:46   Mr Brain Wo Contrast  Result Date: 06/15/2017 CLINICAL DATA:  Acute dysesthesias. EXAM: MRI HEAD WITHOUT CONTRAST TECHNIQUE: Multiplanar, multiecho pulse sequences of the brain and surrounding structures were obtained without intravenous contrast. COMPARISON:  CT same day FINDINGS: The study suffers from motion degradation. Brain: Diffusion imaging does not show any acute or subacute infarction. Brainstem and cerebellum are unremarkable. Cerebral hemispheres show an old infarction in the left parietal lobe which has progressed to atrophy, encephalomalacia and gliosis. There are mild chronic small-vessel changes elsewhere in the deep white matter. No mass lesion, hemorrhage, hydrocephalus or extra-axial collection. Vascular: Major vessels at the base of the brain show flow. Skull and upper cervical spine: Negative Sinuses/Orbits: Left maxillary sinusitis. Left mastoid effusion. Orbits negative. Other: None IMPRESSION: No acute finding. Motion degraded exam. Old left parietal cortical and subcortical infarction. Left maxillary sinus mucosal inflammatory disease. Left mastoid effusion. Electronically Signed   By: Nelson Chimes M.D.   On: 06/15/2017 19:41   Mr Cervical Spine Wo Contrast  Result Date: 06/15/2017 CLINICAL DATA:  Acute dysesthesias. EXAM: MRI CERVICAL SPINE WITHOUT CONTRAST TECHNIQUE: Multiplanar, multisequence MR imaging of the cervical spine was performed. No intravenous contrast was administered. COMPARISON:  None. FINDINGS: Significantly motion degraded exam. Alignment: Kyphotic curvature throughout the cervical region. Vertebrae: No evidence of fracture or focal  bone lesion. Cord: No evidence of cord compression or primary cord lesion. Posterior Fossa, vertebral arteries, paraspinal tissues: Negative Disc levels: Disc degeneration throughout the cervical region with desiccation and loss of height. At no level is there appear to be a disc herniation. No compressive stenosis of the canal. There is foraminal stenosis bilaterally at C5-6, C6-7 and C7-T1 which could possibly cause neural compression. IMPRESSION: Limited information based on motion degradation throughout the exam. Kyphotic curvature of the thoracic spine. Diffuse cervical spondylosis. No compressive central canal stenosis. Bilateral foraminal narrowing at C5-6, C6-7 and C7-T1 which could possibly be associated with neural compression. Electronically Signed   By: Nelson Chimes M.D.   On: 06/15/2017 19:38    Scheduled Meds: . calcium carbonate  1,250 mg Oral Daily  . cholecalciferol  1,000 Units Oral Daily  . magnesium oxide  400 mg Oral Daily  . niacin  50 mg Oral Daily  . pyridOXINE  50 mg Oral Daily  . vitamin B-12  1,000 mcg Oral Daily  . vitamin C  1,000 mg Oral Daily  . vitamin E  400 Units Oral Daily  . zinc sulfate  220 mg Oral Daily   Continuous Infusions: . sodium chloride 100 mL/hr at 06/17/17 0444  . levETIRAcetam 500 mg (06/17/17 7408)    Marzetta Board, MD, PhD [facility] Pager [phone]  If 7PM-7AM, please contact night-coverage [email or web address] Password [facility] 06/17/2017, 12:32 PM

## 2017-06-18 DIAGNOSIS — R4182 Altered mental status, unspecified: Secondary | ICD-10-CM

## 2017-06-18 DIAGNOSIS — D6861 Antiphospholipid syndrome: Secondary | ICD-10-CM

## 2017-06-18 DIAGNOSIS — Z7901 Long term (current) use of anticoagulants: Secondary | ICD-10-CM

## 2017-06-18 DIAGNOSIS — G40209 Localization-related (focal) (partial) symptomatic epilepsy and epileptic syndromes with complex partial seizures, not intractable, without status epilepticus: Secondary | ICD-10-CM

## 2017-06-18 LAB — PROTIME-INR
INR: 2.98
Prothrombin Time: 30.8 seconds — ABNORMAL HIGH (ref 11.4–15.2)

## 2017-06-18 LAB — LEVETIRACETAM LEVEL: LEVETIRACETAM: NOT DETECTED ug/mL (ref 10.0–40.0)

## 2017-06-18 MED ORDER — LEVETIRACETAM 500 MG PO TABS: 500.0000 mg | ORAL_TABLET | Freq: Two times a day (BID) | ORAL | 0 refills | Status: AC

## 2017-06-18 NOTE — Care Management Note (Signed)
Case Management Note  Patient Details  Name: [name] MRN: [id number] Date of Birth: 1940/08/29  Subjective/Objective:  Pt admitted on 06/15/17 with AMS.  PTA, pt independent, lives at home alone.                   Action/Plan: Will follow for discharge planning as pt progresses.  Recommend PT/OT consults to determine home needs.    Expected Discharge Date:  06/18/17               Expected Discharge Plan:  [place]  In-House Referral:     Discharge planning Services  CM Consult  Post Acute Care Choice:  Home Health Choice offered to:  Patient  DME Arranged:  [place] [facility] DME Agency:  [place] Arranged:  PT, OT [facility] Agency:  [place]  Status of Service:  Completed, signed off  If discussed at [place] of Stay Meetings, dates discussed:    Additional Comments:  06/18/17 J. Colena Ketterman, RN, BSN Pt medically stable for discharge home today.  He states he plans to dc home with son for a while.  Referral to [facility] for [facility] and DME needs.  Start of care 24-48h post dc date.  PCP is Dr. Dorothyann Peng at [facility] at [place].  Will confirm discharge address and phone # with pt's son upon his arrival.    Reinaldo Raddle, RN, BSN  Trauma/Neuro ICU Case Manager [phone]

## 2017-06-18 NOTE — Progress Notes (Signed)
Subjective: Patient is markedly improved. States"I am doing much better"  Exam: Vitals:   06/18/17 0108 06/18/17 0428  BP: 126/70 132/72  Pulse: 66 60  Resp: 11 11  Temp: 97.8 F (36.6 C) 98 F (36.7 C)  SpO2: 98% 98%   Gen: In bed, NAD Resp: non-labored breathing, no acute distress Abd: soft, nt  Neuro: MS: Awake, alert, knows the year is 2019, gives month as January.  Motor: MAEW, ? decerased left grip strength due to arthritis.  Sensory:intact to LT   Impression: 77 yo M with delirium of unclear wtiology. EEG without evidence of ongoing seizure, MRI without evidence of stroke. At this point, I am not sure what caused his delirium, but it seems to be clearing.  With improvement, my suspicion is that further workup is unlikely to be helpful. If he were to reworsen, would consider continuous EEG to rule out intermittent seizures.   Recommendations: 1) please call with further questions or concerns.    Roland Rack, MD [facility] [phone]  If 7pm- 7am, please page neurology on call as listed in [place].

## 2017-06-18 NOTE — Plan of Care (Signed)
Patient slept well and had stable vitals through night.  Discussed patient progress with son.

## 2017-06-18 NOTE — Progress Notes (Signed)
CSW received a call from pt's RN staff stating pt is ready for D/C, the pt's son has arrived to p/u the pt but the pt's rolling walker with 5 inch wheels has not arrived.  Per notes, pt's CM had documented the need and the plan for a walker from [employer], but it has not arrived.  CSW consulted with the CM Director who is seeking equipment in the vicinity and this Probation officer has contacted [employer] answering service who has stated they will forward this request for the delivery of the ordered walker to local employees.4  8:10 PM CM Director called and stated a walker has been located, RN has been updated and it is being held on 5N in a storage area until the RN can retrieve it.  Please reconsult if future social work needs arise.  CSW signing off, as social work intervention is no longer needed.  Alphonse Guild. Zakari Bathe, LCSW, LCAS, CSI Clinical Social Worker Ph: [phone]

## 2017-06-18 NOTE — Discharge Summary (Signed)
Physician Discharge Summary  [name] EYC:[id number] DOB: 1941-01-23 DOA: 06/15/2017  PCP: Dorothyann Peng, NP  Admit date: 06/15/2017 Discharge date: 06/18/2017  Admitted From: Home Disposition: Home  Recommendations for Outpatient Follow-up:  1. Follow up with PCP in 1 week 2. Please obtain BMP/CBC in one week 3. Please follow up on the following pending results: None  Home Health: Physical therapy Equipment/Devices: Rolling walker  Discharge Condition: Stable CODE STATUS: Full code Diet recommendation: Heart healthy   Brief/Interim Summary:  Admission HPI written by Caren Griffins, MD   Chief Complaint: Altered mental status with weakness  HPI: [name] is a 77 y.o. male with medical history significant of antiphospholipid syndrome with residual weakness due to stroke in 2000 as well as complex partial seizures on Keppra who was brought in by family secondary to recurrent altered mental status. He initially was complaining of abnormal sensation in both extremities. Patient was having some electric type sensation all over but most especially in the lower extremities. In the ER however he became more drowsy and confused. Patient goes in and out of confusion. Complaining of wanting to sleep all the time. Soreness with the patient who reported multiple episode of decreased level of consciousness that comes and goes. Patient has also been having multiple falls.  Patient has been seen and evaluated by neurology. He has had CT of the brain and MRI so far with no new findings. He is being admitted for further neurologic evaluation of his current altered mental status  ED Course: CT scan of the brain as well as MRI of the brain and C-spine were done. Also far negative. CBC BMP is also no abnormal findings    Hospital course:  Acute encephalopathy /acute delirium of unknown etiology Unclear etiology, appreciate neurology consultation, patient underwent an MRI which was  negative, EEG negative. He improved to alert and oriented. TSH mildly elevated. Recommend repeat in 4-6 weeks. Neurology recommended [facility] for which patient was given a new prescription.  History of partial seizures Currently on Keppra, EEG without any epileptiform discharges, it did show mild diffuse slowing of the waking background. He is baseline.  Antiphospholipid syndrome On Coumadin which was resumed.  Prior CVA History of left parieto-occipital stroke in 2000, no residual weakness, on Coumadin. Home PT on discharge.  Neck extensor weakness Currently being evaluated by neurology for myasthenia gravis versus Lambert-Eaton syndrome, ACH receptor binding, blocking and modulating antibodies as well as voltage-gated calcium channel antibodies were ordered.  Lower extremity swelling /cardiomegaly on chest x-ray mild central vascular congestion Transthoracic Echocardiogram significant for grade 1 diastolic dysfunction with bi-atrial enlargement. Outpatient follow-up    Discharge Diagnoses:  Principal Problem:   Encephalopathy acute Active Problems:   Antiphospholipid syndrome (HCC)   Complex partial seizure (HCC)   Chronic anticoagulation   Encephalopathy    Discharge Instructions  Discharge Instructions    Call MD for:  extreme fatigue   Complete by:  As directed    Call MD for:  persistant dizziness or light-headedness   Complete by:  As directed    Diet - low sodium heart healthy   Complete by:  As directed    Increase activity slowly   Complete by:  As directed      Allergies as of 06/18/2017      Reactions   Aspirin    Other reaction(s): Other (See Comments) Other Reaction: GI Upset      Medication List    TAKE these medications  B-6 PO Take 50 mg daily by mouth.   BETAINE (TRIMETHYLGLYCINE) PO Take 1 tablet by mouth daily.   CALCIUM 600 PO Take 1 tablet daily by mouth.   cholecalciferol 1000 units tablet Commonly known as:  VITAMIN D Take  1,000 Units daily by mouth.   levETIRAcetam 500 MG tablet Commonly known as:  KEPPRA Take 1 tablet (500 mg total) by mouth 2 (two) times daily.   Magnesium Oxide 250 MG Tabs Take 250 mg by mouth daily.   niacin 100 MG tablet Take 50 mg daily by mouth.   RA GLUCOSAMINE-CHONDROIT-MSM Tabs Take 1 tablet by mouth 2 (two) times daily.   vitamin B-12 1000 MCG tablet Commonly known as:  CYANOCOBALAMIN Take 1,000 mcg daily by mouth.   vitamin C 1000 MG tablet Take 1,000 mg daily by mouth.   vitamin E 400 UNIT capsule Take 400 Units daily by mouth.   warfarin 5 MG tablet Commonly known as:  COUMADIN Take as directed. If you are unsure how to take this medication, talk to your nurse or doctor. Original instructions:  Take 1 tablet (5 mg total) by mouth as directed. What changed:    how much to take  when to take this  additional instructions   Zinc 50 MG Caps Take 1 capsule daily by mouth.            Durable Medical Equipment  (From admission, onward)        Start     Ordered   06/18/17 1335  For home use only DME Walker rolling  Once    Question Answer Comment  Patient needs a walker to treat with the following condition Acute encephalopathy   Patient needs a walker to treat with the following condition Debility      06/18/17 [place]    Nafziger, Tommi Rumps, NP. Schedule an appointment as soon as possible for a visit in 1 week(s).   Specialty:  Family Medicine Contact information: [place] [address] [phone]          Allergies  Allergen Reactions  . Aspirin     Other reaction(s): Other (See Comments) Other Reaction: GI Upset    Consultations:  Neurology   Procedures/Studies: Ct Angio Head W Or Wo Contrast  Result Date: 06/15/2017 CLINICAL DATA:  Bilateral lower extremity weakness EXAM: CT ANGIOGRAPHY HEAD AND NECK TECHNIQUE: Multidetector CT imaging of the head and neck was performed using the  standard protocol during bolus administration of intravenous contrast. Multiplanar CT image reconstructions and MIPs were obtained to evaluate the vascular anatomy. Carotid stenosis measurements (when applicable) are obtained utilizing NASCET criteria, using the distal internal carotid diameter as the denominator. CONTRAST:  12mL ISOVUE-370 IOPAMIDOL (ISOVUE-370) INJECTION 76% COMPARISON:  Brain MRI and head CT 06/15/2017 FINDINGS: CTA NECK FINDINGS Aortic arch: There is no calcific atherosclerosis of the aortic arch. There is no aneurysm, dissection or hemodynamically significant stenosis of the visualized ascending aorta and aortic arch. There is variant aortic arch anatomy. The brachiocephalic and left common carotid arteries share a common origin. The left vertebral artery arises independently from the aortic arch. The visualized proximal subclavian arteries are normal. Right carotid system: The right common carotid origin is widely patent. There is no common carotid or internal carotid artery dissection or aneurysm. No hemodynamically significant stenosis. Left carotid system: The left common carotid origin is widely patent. There is no common carotid or internal carotid artery dissection or aneurysm. No  hemodynamically significant stenosis. Vertebral arteries: The vertebral system is left dominant. Both vertebral artery origins are normal. Both vertebral arteries are normal to their confluence with the basilar artery. Skeleton: There is no bony spinal canal stenosis. No lytic or blastic lesions. Other neck: The nasopharynx is clear. The oropharynx and hypopharynx are normal. The epiglottis is normal. The supraglottic larynx, glottis and subglottic larynx are normal. No retropharyngeal collection. The parapharyngeal spaces are preserved. The parotid and submandibular glands are normal. No sialolithiasis or salivary ductal dilatation. The thyroid gland is normal. There is no cervical lymphadenopathy. Upper  chest: No pneumothorax or pleural effusion. No nodules or masses. Review of the MIP images confirms the above findings CTA HEAD FINDINGS Anterior circulation: --Intracranial internal carotid arteries: Normal. --Anterior cerebral arteries: Normal. --Middle cerebral arteries: There is moderate narrowing of the proximal M 1 segment of the left middle cerebral artery. The distal branches of the left MCA distribution are attenuated compared to the right. The right MCA is normal. --Posterior communicating arteries: Present on the left, absent on the right. Posterior circulation: --Posterior cerebral arteries: Normal. --Superior cerebellar arteries: Normal. --Basilar artery: Normal. --Anterior inferior cerebellar arteries: Normal. --Posterior inferior cerebellar arteries: Normal. Venous sinuses: As permitted by contrast timing, patent. Anatomic variants: Fetal origin of the left posterior cerebral artery. Delayed phase: No parenchymal contrast enhancement. Unchanged appearance of old left parietal lobe infarct. Review of the MIP images confirms the above findings. IMPRESSION: 1. No emergent large vessel occlusion. 2. Moderate narrowing of the proximal M 1 segment of the left middle cerebral artery with attenuated appearance of the distal branches. This is probably chronic, given the old left posterior MCA distribution infarct. 3. No hemodynamically significant stenosis or other abnormality of the carotid or vertebral arterial systems. Electronically Signed   By: Ulyses Jarred M.D.   On: 06/15/2017 22:46   Dg Chest 2 View  Result Date: 06/15/2017 CLINICAL DATA:  Peripheral edema. EXAM: CHEST  2 VIEW COMPARISON:  Radiograph 10/13/2007 FINDINGS: Borderline cardiomegaly. Mild central vascular congestion without pulmonary edema. Streaky bibasilar atelectasis. No pleural fluid or focal consolidation. No pneumothorax. No acute osseous abnormalities are seen. IMPRESSION: Borderline cardiomegaly and mild central vascular  congestion. Streaky bibasilar atelectasis. Electronically Signed   By: Jeb Levering M.D.   On: 06/15/2017 22:35   Ct Head Wo Contrast  Result Date: 06/15/2017 CLINICAL DATA:  Altered mental status, nonresponsive EXAM: CT HEAD WITHOUT CONTRAST TECHNIQUE: Contiguous axial images were obtained from the base of the skull through the vertex without intravenous contrast. COMPARISON:  Oct 13, 2007 FINDINGS: Brain: Mild diffuse atrophy is stable. There is a cavum septum pellucidum and cavum vergae, an anatomic variant. There is no intracranial mass, hemorrhage, extra-axial fluid collection, or midline shift. There is a stable prior infarct at the left temporoparietal junction, stable. Elsewhere, there is slight small vessel disease in the centra semiovale bilaterally. No new gray-white compartment lesions evident. No acute infarct appreciable. There are calcifications in the basal ganglia region, stable and felt to be physiologic. Vascular: There is no appreciable hyperdense vessel. There is calcification in each carotid siphon. Skull: Bony calvarium appears intact. Sinuses/Orbits: There is mucosal thickening and opacification involving multiple ethmoid air cells. Other visualized paranasal sinuses are clear. Orbits appear symmetric bilaterally. Other: Mastoids on the right are clear. There is extensive mastoid disease on the left, also present on prior study. There is debris in each external auditory canal. IMPRESSION: 1. Mild atrophy with prior left temporal-parietal junction infarct, stable. There is slight  periventricular small vessel disease. No acute infarct evident. No mass or hemorrhage. 2.  There are foci of arterial vascular calcification. 3.  Ethmoid sinus disease bilaterally. 4. Chronic mastoid disease on the left. Probable cerumen in each external auditory canal. Electronically Signed   By: Lowella Grip III M.D.   On: 06/15/2017 16:30   Ct Angio Neck W And/or Wo Contrast  Result Date:  06/15/2017 CLINICAL DATA:  Bilateral lower extremity weakness EXAM: CT ANGIOGRAPHY HEAD AND NECK TECHNIQUE: Multidetector CT imaging of the head and neck was performed using the standard protocol during bolus administration of intravenous contrast. Multiplanar CT image reconstructions and MIPs were obtained to evaluate the vascular anatomy. Carotid stenosis measurements (when applicable) are obtained utilizing NASCET criteria, using the distal internal carotid diameter as the denominator. CONTRAST:  7mL ISOVUE-370 IOPAMIDOL (ISOVUE-370) INJECTION 76% COMPARISON:  Brain MRI and head CT 06/15/2017 FINDINGS: CTA NECK FINDINGS Aortic arch: There is no calcific atherosclerosis of the aortic arch. There is no aneurysm, dissection or hemodynamically significant stenosis of the visualized ascending aorta and aortic arch. There is variant aortic arch anatomy. The brachiocephalic and left common carotid arteries share a common origin. The left vertebral artery arises independently from the aortic arch. The visualized proximal subclavian arteries are normal. Right carotid system: The right common carotid origin is widely patent. There is no common carotid or internal carotid artery dissection or aneurysm. No hemodynamically significant stenosis. Left carotid system: The left common carotid origin is widely patent. There is no common carotid or internal carotid artery dissection or aneurysm. No hemodynamically significant stenosis. Vertebral arteries: The vertebral system is left dominant. Both vertebral artery origins are normal. Both vertebral arteries are normal to their confluence with the basilar artery. Skeleton: There is no bony spinal canal stenosis. No lytic or blastic lesions. Other neck: The nasopharynx is clear. The oropharynx and hypopharynx are normal. The epiglottis is normal. The supraglottic larynx, glottis and subglottic larynx are normal. No retropharyngeal collection. The parapharyngeal spaces are preserved.  The parotid and submandibular glands are normal. No sialolithiasis or salivary ductal dilatation. The thyroid gland is normal. There is no cervical lymphadenopathy. Upper chest: No pneumothorax or pleural effusion. No nodules or masses. Review of the MIP images confirms the above findings CTA HEAD FINDINGS Anterior circulation: --Intracranial internal carotid arteries: Normal. --Anterior cerebral arteries: Normal. --Middle cerebral arteries: There is moderate narrowing of the proximal M 1 segment of the left middle cerebral artery. The distal branches of the left MCA distribution are attenuated compared to the right. The right MCA is normal. --Posterior communicating arteries: Present on the left, absent on the right. Posterior circulation: --Posterior cerebral arteries: Normal. --Superior cerebellar arteries: Normal. --Basilar artery: Normal. --Anterior inferior cerebellar arteries: Normal. --Posterior inferior cerebellar arteries: Normal. Venous sinuses: As permitted by contrast timing, patent. Anatomic variants: Fetal origin of the left posterior cerebral artery. Delayed phase: No parenchymal contrast enhancement. Unchanged appearance of old left parietal lobe infarct. Review of the MIP images confirms the above findings. IMPRESSION: 1. No emergent large vessel occlusion. 2. Moderate narrowing of the proximal M 1 segment of the left middle cerebral artery with attenuated appearance of the distal branches. This is probably chronic, given the old left posterior MCA distribution infarct. 3. No hemodynamically significant stenosis or other abnormality of the carotid or vertebral arterial systems. Electronically Signed   By: Ulyses Jarred M.D.   On: 06/15/2017 22:46   Mr Brain Wo Contrast  Result Date: 06/15/2017 CLINICAL DATA:  Acute dysesthesias.  EXAM: MRI HEAD WITHOUT CONTRAST TECHNIQUE: Multiplanar, multiecho pulse sequences of the brain and surrounding structures were obtained without intravenous contrast.  COMPARISON:  CT same day FINDINGS: The study suffers from motion degradation. Brain: Diffusion imaging does not show any acute or subacute infarction. Brainstem and cerebellum are unremarkable. Cerebral hemispheres show an old infarction in the left parietal lobe which has progressed to atrophy, encephalomalacia and gliosis. There are mild chronic small-vessel changes elsewhere in the deep white matter. No mass lesion, hemorrhage, hydrocephalus or extra-axial collection. Vascular: Major vessels at the base of the brain show flow. Skull and upper cervical spine: Negative Sinuses/Orbits: Left maxillary sinusitis. Left mastoid effusion. Orbits negative. Other: None IMPRESSION: No acute finding. Motion degraded exam. Old left parietal cortical and subcortical infarction. Left maxillary sinus mucosal inflammatory disease. Left mastoid effusion. Electronically Signed   By: Nelson Chimes M.D.   On: 06/15/2017 19:41   Mr Cervical Spine Wo Contrast  Result Date: 06/15/2017 CLINICAL DATA:  Acute dysesthesias. EXAM: MRI CERVICAL SPINE WITHOUT CONTRAST TECHNIQUE: Multiplanar, multisequence MR imaging of the cervical spine was performed. No intravenous contrast was administered. COMPARISON:  None. FINDINGS: Significantly motion degraded exam. Alignment: Kyphotic curvature throughout the cervical region. Vertebrae: No evidence of fracture or focal bone lesion. Cord: No evidence of cord compression or primary cord lesion. Posterior Fossa, vertebral arteries, paraspinal tissues: Negative Disc levels: Disc degeneration throughout the cervical region with desiccation and loss of height. At no level is there appear to be a disc herniation. No compressive stenosis of the canal. There is foraminal stenosis bilaterally at C5-6, C6-7 and C7-T1 which could possibly cause neural compression. IMPRESSION: Limited information based on motion degradation throughout the exam. Kyphotic curvature of the thoracic spine. Diffuse cervical  spondylosis. No compressive central canal stenosis. Bilateral foraminal narrowing at C5-6, C6-7 and C7-T1 which could possibly be associated with neural compression. Electronically Signed   By: Nelson Chimes M.D.   On: 06/15/2017 19:38     Transthoracic Echocardiogram (06/17/2017)  Study Conclusions  - Left ventricle: The cavity size was normal. There was moderate   concentric hypertrophy. Systolic function was normal. The   estimated ejection fraction was in the range of 60% to 65%. Wall   motion was normal; there were no regional wall motion   abnormalities. Doppler parameters are consistent with abnormal   left ventricular relaxation (grade 1 diastolic dysfunction). - Aortic valve: Transvalvular velocity was increased. There was   mild stenosis. Valve area (VTI): 1.6 cm^2. Valve area (Vmax):   1.53 cm^2. Valve area (Vmean): 1.57 cm^2. - Aortic root: The aortic root was mildly dilated. - Mitral valve: Mildly calcified annulus. - Left atrium: The atrium was moderately dilated. - Right atrium: The atrium was moderately dilated.   Subjective: No concerns today. Feels he may be slightly weaker than when he was first admitted.  Discharge Exam: Vitals:   06/18/17 0108 06/18/17 0428  BP: 126/70 132/72  Pulse: 66 60  Resp: 11 11  Temp: 97.8 F (36.6 C) 98 F (36.7 C)  SpO2: 98% 98%   Vitals:   06/17/17 1544 06/17/17 2033 06/18/17 0108 06/18/17 0428  BP: (!) 107/59 119/65 126/70 132/72  Pulse: (!) 58 (!) 55 66 60  Resp: 15 11 11 11   Temp: 97.8 F (36.6 C) 97.9 F (36.6 C) 97.8 F (36.6 C) 98 F (36.7 C)  TempSrc: Oral Oral Oral Oral  SpO2: 100% 99% 98% 98%    General: Pt is alert, awake, not in acute distress  Cardiovascular: RRR, S1/S2 +, no rubs, no gallops Respiratory: CTA bilaterally, no wheezing, no rhonchi Abdominal: Soft, NT, ND, bowel sounds + Extremities: LE edema, no cyanosis    The results of significant diagnostics from this hospitalization (including  imaging, microbiology, ancillary and laboratory) are listed below for reference.     Microbiology: Recent Results (from the past 240 hour(s))  MRSA PCR Screening     Status: None   Collection Time: 06/16/17  8:34 PM  Result Value Ref Range Status   MRSA by PCR NEGATIVE NEGATIVE Final    Comment:        The GeneXpert MRSA Assay (FDA approved for NASAL specimens only), is one component of a comprehensive MRSA colonization surveillance program. It is not intended to diagnose MRSA infection nor to guide or monitor treatment for MRSA infections.      Labs: BNP (last 3 results) Recent Labs    06/15/17 2231  BNP 678.9*   Basic Metabolic Panel: Recent Labs  Lab 06/15/17 1336 06/15/17 1601 06/16/17 1553 06/17/17 0349  NA 133*  --  138 138  K 4.3  --  3.9 4.0  CL 101  --  106 106  CO2 23  --  23 25  GLUCOSE 126*  --  79 68  BUN 16  --  12 14  CREATININE 0.86  --  1.02 1.06  CALCIUM 9.1  --  9.2 8.9  MG  --  1.9  --   --    Liver Function Tests: Recent Labs  Lab 06/15/17 1600 06/16/17 1553 06/17/17 0349  AST 73* 65* 55*  ALT 46 40 36  ALKPHOS 90 84 78  BILITOT 1.1 0.9 0.9  PROT 7.6 7.1 6.3*  ALBUMIN 3.4* 3.1* 2.8*   No results for input(s): LIPASE, AMYLASE in the last 168 hours. Recent Labs  Lab 06/15/17 1600  AMMONIA 23   CBC: Recent Labs  Lab 06/15/17 1336 06/16/17 1553 06/17/17 0349  WBC 2.9* 2.4* 2.2*  HGB 10.3* 10.7* 10.5*  HCT 33.0* 34.7* 32.9*  MCV 90.2 91.1 91.4  PLT 148* 138* 138*   Cardiac Enzymes: Recent Labs  Lab 06/15/17 2231 06/16/17 0400 06/16/17 1553 06/16/17 2126 06/17/17 0349  TROPONINI 0.03* 0.03* <0.03 <0.03 <0.03   BNP: Invalid input(s): POCBNP CBG: Recent Labs  Lab 06/15/17 1601 06/15/17 2056  GLUCAP 114* 101*   D-Dimer No results for input(s): DDIMER in the last 72 hours. Hgb A1c No results for input(s): HGBA1C in the last 72 hours. Lipid Profile No results for input(s): CHOL, HDL, LDLCALC, TRIG, CHOLHDL,  LDLDIRECT in the last 72 hours. Thyroid function studies Recent Labs    06/16/17 1655  TSH 8.369*   Anemia work up Recent Labs    06/15/17 2231  VITAMINB12 713   Urinalysis    Component Value Date/Time   COLORURINE STRAW (A) 06/15/2017 1720   APPEARANCEUR CLEAR 06/15/2017 1720   LABSPEC 1.005 06/15/2017 1720   PHURINE 7.0 06/15/2017 1720   GLUCOSEU NEGATIVE 06/15/2017 1720   HGBUR NEGATIVE 06/15/2017 1720   BILIRUBINUR NEGATIVE 06/15/2017 1720   KETONESUR NEGATIVE 06/15/2017 1720   PROTEINUR NEGATIVE 06/15/2017 1720   NITRITE NEGATIVE 06/15/2017 1720   LEUKOCYTESUR NEGATIVE 06/15/2017 1720   Sepsis Labs Invalid input(s): PROCALCITONIN,  WBC,  LACTICIDVEN Microbiology Recent Results (from the past 240 hour(s))  MRSA PCR Screening     Status: None   Collection Time: 06/16/17  8:34 PM  Result Value Ref Range Status   MRSA by PCR NEGATIVE  NEGATIVE Final    Comment:        The GeneXpert MRSA Assay (FDA approved for NASAL specimens only), is one component of a comprehensive MRSA colonization surveillance program. It is not intended to diagnose MRSA infection nor to guide or monitor treatment for MRSA infections.      Time coordinating discharge: Over 30 minutes  SIGNED:   Cordelia Poche, MD [facility] 06/18/2017, 1:44 PM Pager [phone]  If 7PM-7AM, please contact night-coverage [email or web address] Password [email or web address]

## 2017-06-18 NOTE — Care Management Important Message (Signed)
Important Message  Patient Details  Name: CARLUS STAY MRN: [id number] Date of Birth: 26-Oct-1940   Medicare Important Message Given:  Yes    Ella Bodo, RN 06/18/2017, 2:50 PM

## 2017-06-18 NOTE — Progress Notes (Signed)
Spoke with son approx 5:15 this evening. He stated he was on the way to his second job when this nurse told him that his father had been d/c from the hospital. He stated he would have to call work and let them know he won't be coming. Son also asked if their was a cut off time what he could be picked up by. Son stated he would call me back to let me know what time he would be here. I have not heard back and its now 7:05. Attempted a call to his cell phone approx 3 minutes ago, but no answer and unable to leave a message. D/c is completed, just waiting on son to pick pt up.

## 2017-06-18 NOTE — Evaluation (Signed)
Physical Therapy Evaluation Patient Details Name: [name] MRN: [id number] DOB: [birth year] Today's Date: 06/18/2017   History of Present Illness  Pt is a 77 y/o male who presents with delirium of unclear etiology. MRI and EEG negative for acute changes. PMH significant for CVA 2000, complex partial seizure.  Clinical Impression  Pt admitted with above diagnosis. Pt currently with functional limitations due to the deficits listed below (see PT Problem List). At the time of PT eval pt intermittently confused and had difficulty following commands. He reports he is at the hospital because he was "hearing sounds in the apartment, and my shoelaces were moving on their own." With mobility, pt requiring heavy mod assist for ambulation without an AD, and occasional min assist for ambulation with the RW. At this time, do not feel pt is safe to return home alone, and recommend at least 1 more PT visit and 24 hour assistance in place prior to d/c. Pt will benefit from skilled PT to increase their independence and safety with mobility to allow discharge to the venue listed below.       Follow Up Recommendations Home health PT;Supervision/Assistance - 24 hour    Equipment Recommendations  Rolling walker with 5" wheels;3in1 (PT)    Recommendations for Other Services       Precautions / Restrictions Precautions Precautions: Fall Restrictions Weight Bearing Restrictions: No      Mobility  Bed Mobility Overal bed mobility: Modified Independent             General bed mobility comments: Pt was able to transition to/from EOB without difficulty. Pt required increased time to process command and initiate movement each time.   Transfers Overall transfer level: Needs assistance Equipment used: 1 person hand held assist Transfers: Sit to/from Stand Sit to Stand: Min assist         General transfer comment: Assist for balance support and safety without RW. With RW pt was able to complete  transfers with close guard only.   Ambulation/Gait Ambulation/Gait assistance: Mod assist;Min assist Ambulation Distance (Feet): 300 Feet Assistive device: Rolling walker (2 wheeled);1 person hand held assist Gait Pattern/deviations: Step-through pattern;Decreased stride length;Trunk flexed Gait velocity: Decreased Gait velocity interpretation: Below normal speed for age/gender General Gait Details: Initially ambulating without AD. Pt had 4 LOB requiring heavy mod assist to recover between EOB and the door to the hallway. RN got the RW for pt to try, and appeared much more steady. One significant LOB with RW at end of session in which min assist was provided to recover.   Stairs            Wheelchair Mobility    Modified Rankin (Stroke Patients Only)       Balance Overall balance assessment: Needs assistance Sitting-balance support: Feet supported;No upper extremity supported Sitting balance-Leahy Scale: Fair     Standing balance support: No upper extremity supported;During functional activity Standing balance-Leahy Scale: Poor                               Pertinent Vitals/Pain Pain Assessment: No/denies pain    Home Living Family/patient expects to be discharged to:: Private residence Living Arrangements: Alone Available Help at Discharge: Family;Available PRN/intermittently Type of Home: House Home Access: Stairs to enter   [employer] of Steps: Unclear - pt only states "2 level" when asked about STE Home Layout: Two level Home Equipment: Walker - 4 wheels  Prior Function Level of Independence: Independent         Comments: Retired; has been able to do his own grocery shopping/errands.      Hand Dominance        Extremity/Trunk Assessment   Upper Extremity Assessment Upper Extremity Assessment: Overall WFL for tasks assessed    Lower Extremity Assessment Lower Extremity Assessment: Overall WFL for tasks assessed     Cervical / Trunk Assessment Cervical / Trunk Assessment: Other exceptions Cervical / Trunk Exceptions: Kyphotic with significant forward head posture with tilt to the left. Pt reports he wears a cervical collar at all times at home but pt's son took it home.   Communication      Cognition Arousal/Alertness: Awake/alert Behavior During Therapy: WFL for tasks assessed/performed Overall Cognitive Status: Impaired/Different from baseline Area of Impairment: Orientation;Attention;Memory;Following commands;Safety/judgement;Awareness;Problem solving                 Orientation Level: Disoriented to;Situation Current Attention Level: Selective Memory: Decreased short-term memory Following Commands: Follows one step commands inconsistently;Follows one step commands with increased time;Follows multi-step commands inconsistently Safety/Judgement: Decreased awareness of safety;Decreased awareness of deficits Awareness: Intellectual Problem Solving: Slow processing;Decreased initiation;Requires verbal cues General Comments: Pt with difficulty following commands and with noted decreased initiation of movement. Increased processing time required (asked pt to hold arms up to don gait belt. Once gait belt on, pt kept arms up in the air even when attempting to stand up. Required separate cue to lower arms.). Pt reports that he is here because he was hearing noises in his apartment and his shoe laces were moving on their own. He denies being confused at any point.       General Comments      Exercises     Assessment/Plan    PT Assessment Patient needs continued PT services  PT Problem List Decreased strength;Decreased range of motion;Decreased activity tolerance;Decreased balance;Decreased mobility;Decreased cognition;Decreased knowledge of use of DME;Decreased safety awareness;Decreased knowledge of precautions       PT Treatment Interventions DME instruction;Gait training;Stair  training;Functional mobility training;Therapeutic activities;Therapeutic exercise;Neuromuscular re-education;Patient/family education;Cognitive remediation    PT Goals (Current goals can be found in the Care Plan section)  Acute Rehab PT Goals Patient Stated Goal: Walk more PT Goal Formulation: With patient Time For Goal Achievement: 06/25/17 Potential to Achieve Goals: Good    Frequency Min 3X/week   Barriers to discharge        Co-evaluation               AM-PAC PT "6 Clicks" Daily Activity  Outcome Measure Difficulty turning over in bed (including adjusting bedclothes, sheets and blankets)?: None Difficulty moving from lying on back to sitting on the side of the bed? : A Little Difficulty sitting down on and standing up from a chair with arms (e.g., wheelchair, bedside commode, etc,.)?: A Little Help needed moving to and from a bed to chair (including a wheelchair)?: A Little Help needed walking in hospital room?: A Little Help needed climbing 3-5 steps with a railing? : A Little 6 Click Score: 19    End of Session Equipment Utilized During Treatment: Gait belt Activity Tolerance: Patient tolerated treatment well Patient left: in bed;with call bell/phone within reach;with bed alarm set Nurse Communication: Mobility status PT Visit Diagnosis: Unsteadiness on feet (R26.81);Other abnormalities of gait and mobility (R26.89)    Time: 5621-3086 PT Time Calculation (min) (ACUTE ONLY): 27 min   Charges:   PT Evaluation $PT Eval Moderate Complexity: 1  Mod PT Treatments $Gait Training: 8-22 mins   PT G Codes:        Rolinda Roan, PT, DPT Acute Rehabilitation Services Pager: [phone]   Thelma Comp 06/18/2017, 12:41 PM

## 2017-06-18 NOTE — Progress Notes (Addendum)
Patient and RN reviewed DC paperwork, both signed.  Patient dressed and RAC, RFA IVs removed, no bleeding covered in gauze, belongings returned to pt including clothes, urinated,  transported via [facility] to car pick up area and placed in son's car for D/C.  Rolling walker provided by [facility] and pt left with it.

## 2017-06-21 ENCOUNTER — Telehealth: Payer: Self-pay | Admitting: *Deleted

## 2017-06-21 LAB — ACETYLCHOLINE RECEPTOR AB, ALL
Acety choline binding ab: <0.03 nmol/L (ref 0.00–0.24)
Acetylchol Block Ab: 17 % (ref 0–25)

## 2017-06-21 NOTE — Telephone Encounter (Signed)
Unable to reach patient at time of TCM Call. Left message for patient to return call when available.  

## 2017-06-22 NOTE — Telephone Encounter (Signed)
Unable to reach patient at time of TCM Call. Left message for patient to return call when available.  

## 2017-06-23 NOTE — Telephone Encounter (Signed)
I left a voice message for pt to return my call.  

## 2017-06-28 ENCOUNTER — Ambulatory Visit: Payer: Medicare Other

## 2017-07-02 ENCOUNTER — Telehealth: Payer: Self-pay | Admitting: Physical Therapy

## 2017-07-02 NOTE — Telephone Encounter (Signed)
Called pt regarding recent lapse in treatment. Provided office number and encouraged him to call so we can discuss his progress and either continuing his PT or discharging him from PT.   8:14 AM,07/02/17 Sherol Dade PT, [place] [facility]

## 2017-07-12 ENCOUNTER — Ambulatory Visit: Payer: Medicare Other

## 2017-07-14 ENCOUNTER — Ambulatory Visit (INDEPENDENT_AMBULATORY_CARE_PROVIDER_SITE_OTHER): Payer: Medicare Other | Admitting: General Practice

## 2017-07-14 DIAGNOSIS — Z7901 Long term (current) use of anticoagulants: Secondary | ICD-10-CM | POA: Diagnosis not present

## 2017-07-14 LAB — POCT INR: INR: 3.5

## 2017-07-14 NOTE — Progress Notes (Signed)
I have reviewed and agree with this plan  

## 2017-07-14 NOTE — Patient Instructions (Addendum)
Pre visit review using our clinic review tool, if applicable. No additional management support is needed unless otherwise documented below in the visit note.  Take 5 mg today (2/6) and then continue to take 7.5 mg all days except 5 mg on Monday/Fridays.  Re-check in 4 weeks.

## 2017-08-11 ENCOUNTER — Telehealth: Payer: Self-pay | Admitting: General Practice

## 2017-08-11 ENCOUNTER — Telehealth: Payer: Self-pay | Admitting: Adult Health

## 2017-08-11 ENCOUNTER — Ambulatory Visit (INDEPENDENT_AMBULATORY_CARE_PROVIDER_SITE_OTHER): Payer: Medicare Other | Admitting: General Practice

## 2017-08-11 DIAGNOSIS — Z7901 Long term (current) use of anticoagulants: Secondary | ICD-10-CM

## 2017-08-11 LAB — POCT INR: INR: 5

## 2017-08-11 NOTE — Patient Instructions (Addendum)
Pre visit review using our clinic review tool, if applicable. No additional management support is needed unless otherwise documented below in the visit note.  Skip coumadin today and tomorrow (3/6 and 3/7) and then change dosage and take  7.5 mg all days except 5 mg on Monday/Wednesdays and Fridays.  Re-check in 2 weeks.

## 2017-08-11 NOTE — Telephone Encounter (Signed)
Tried to return call to patient but no answer and no VM has been set up.  I tried both numbers.

## 2017-08-11 NOTE — Telephone Encounter (Signed)
This has been addressed.

## 2017-08-11 NOTE — Progress Notes (Signed)
I have reviewed and agree with this plan  

## 2017-08-11 NOTE — Telephone Encounter (Signed)
Copied from [place] [phone]. Topic: General - Other >> Aug 11, 2017  8:27 AM Darl Householder, RMA wrote: Reason for CRM: Patient is requesting a callback from Meriam Sprague, RN concerning today's appt, please return pt call

## 2017-08-25 ENCOUNTER — Ambulatory Visit: Payer: Medicare Other

## 2017-08-30 ENCOUNTER — Ambulatory Visit: Payer: Medicare Other

## 2017-10-06 DEATH — deceased

## 2018-04-20 IMAGING — CT CT ANGIO NECK
1 of 8 series · 6 of 33 positions shown · IV contrast (APPLIED)
Comparison: Brain MRI and head CT 06/15/2017

CLINICAL DATA: Bilateral lower extremity weakness

EXAM:
CT ANGIOGRAPHY HEAD AND NECK
TECHNIQUE: Multidetector CT imaging of the head and neck was performed using
the standard protocol during bolus administration of intravenous
contrast. Multiplanar CT image reconstructions and MIPs were
obtained to evaluate the vascular anatomy. Carotid stenosis
measurements (when applicable) are obtained utilizing NASCET
criteria, using the distal internal carotid diameter as the
denominator.
CONTRAST:  50mL F9H37L-3WG IOPAMIDOL (F9H37L-3WG) INJECTION 76%

[Series 7: ax thins · axial · 0.39mm/px · z∈[-352,-112]mm · 6 of 338 slices shown]
[im 49/338  soft-tissue]
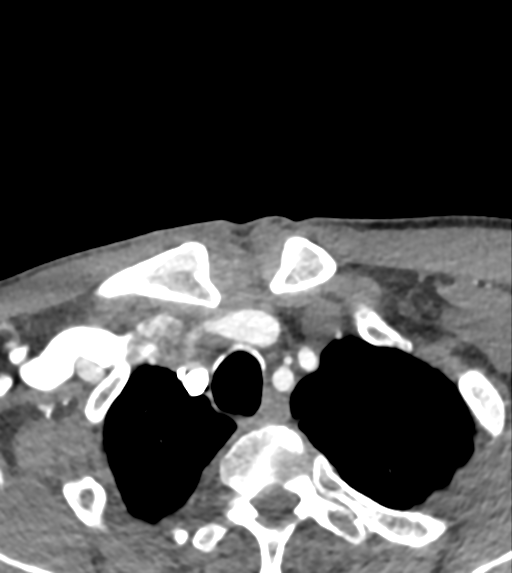
[im 97/338  bone]
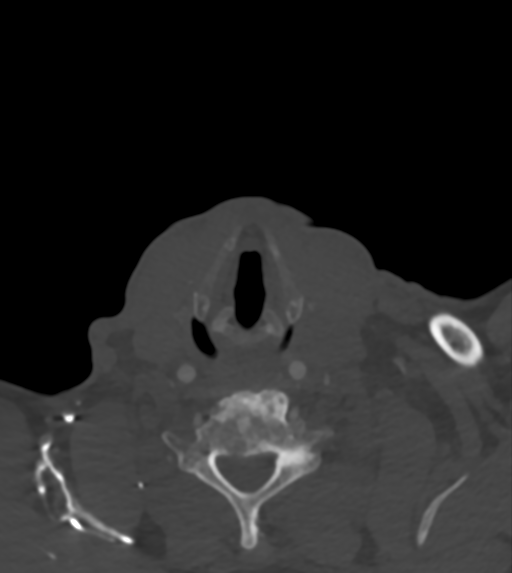
[im 145/338  soft-tissue]
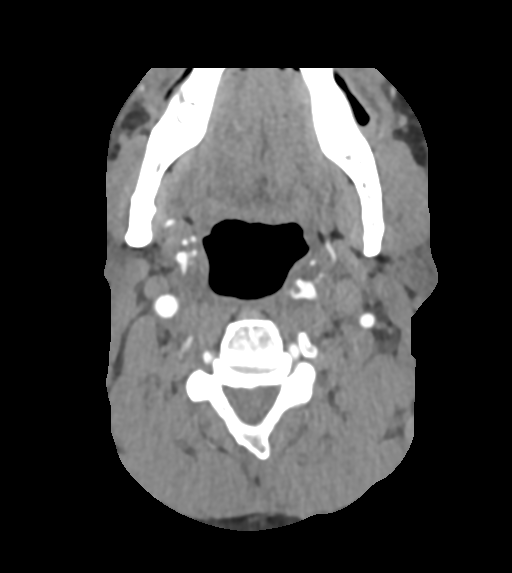
[im 193/338  bone]
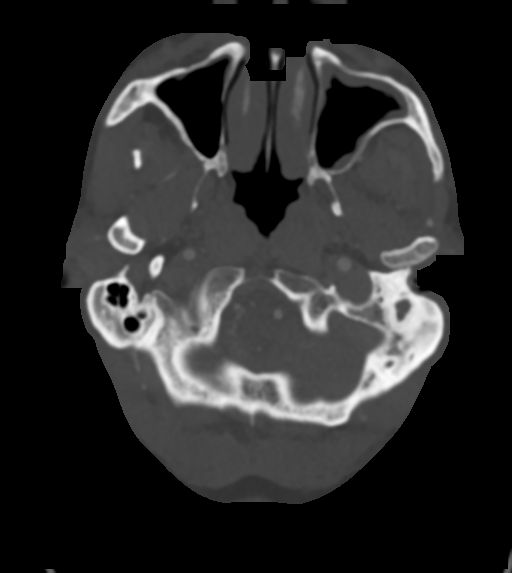
[im 241/338  soft-tissue]
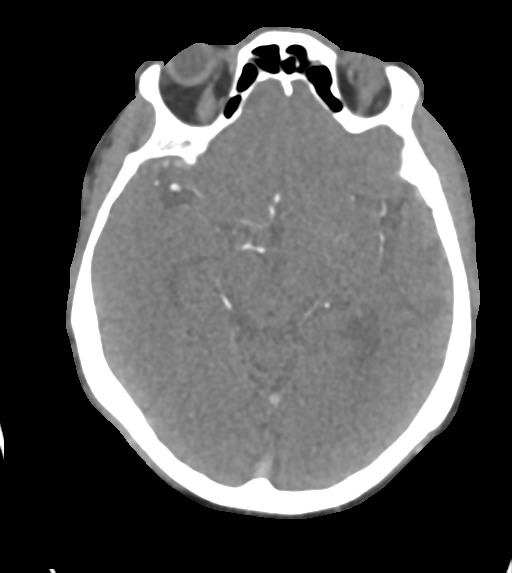
[im 289/338  bone]
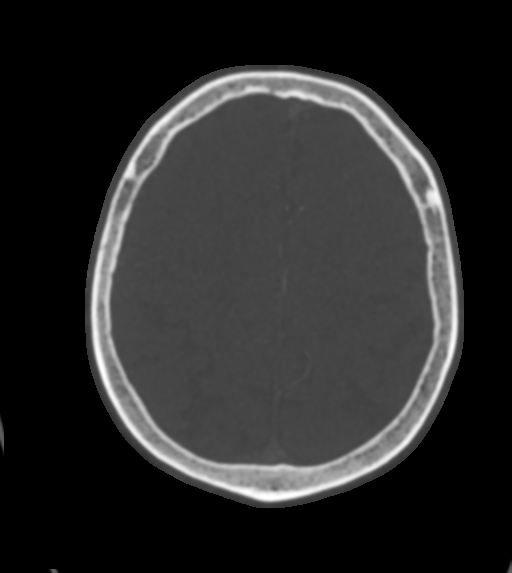

[6 of 33 positions shown; findings below may reference images not displayed]

FINDINGS: CTA NECK FINDINGS

Aortic arch: There is no calcific atherosclerosis of the aortic
arch. There is no aneurysm, dissection or hemodynamically
significant stenosis of the visualized ascending aorta and aortic
arch. There is variant aortic arch anatomy. The brachiocephalic and
left common carotid arteries share a common origin. The left
vertebral artery arises independently from the aortic arch. The
visualized proximal subclavian arteries are normal.

Right carotid system: The right common carotid origin is widely
patent. There is no common carotid or internal carotid artery
dissection or aneurysm. No hemodynamically significant stenosis.

Left carotid system: The left common carotid origin is widely
patent. There is no common carotid or internal carotid artery
dissection or aneurysm. No hemodynamically significant stenosis.

Vertebral arteries: The vertebral system is left dominant. Both
vertebral artery origins are normal. Both vertebral arteries are
normal to their confluence with the basilar artery.

Skeleton: There is no bony spinal canal stenosis. No lytic or
blastic lesions.

Other neck: The nasopharynx is clear. The oropharynx and hypopharynx
are normal. The epiglottis is normal. The supraglottic larynx,
glottis and subglottic larynx are normal. No retropharyngeal
collection. The parapharyngeal spaces are preserved. The parotid and
submandibular glands are normal. No sialolithiasis or salivary
ductal dilatation. The thyroid gland is normal. There is no cervical
lymphadenopathy.

Upper chest: No pneumothorax or pleural effusion. No nodules or
masses.

Review of the MIP images confirms the above findings

CTA HEAD FINDINGS

Anterior circulation:

--Intracranial internal carotid arteries: Normal.

--Anterior cerebral arteries: Normal.

--Middle cerebral arteries: There is moderate narrowing of the
proximal M 1 segment of the left middle cerebral artery. The distal
branches of the left MCA distribution are attenuated compared to the
right. The right MCA is normal.

--Posterior communicating arteries: Present on the left, absent on
the right.

Posterior circulation:

--Posterior cerebral arteries: Normal.

--Superior cerebellar arteries: Normal.

--Basilar artery: Normal.

--Anterior inferior cerebellar arteries: Normal.

--Posterior inferior cerebellar arteries: Normal.

Venous sinuses: As permitted by contrast timing, patent.

Anatomic variants: Fetal origin of the left posterior cerebral
artery.

Delayed phase: No parenchymal contrast enhancement. Unchanged
appearance of old left parietal lobe infarct.

Review of the MIP images confirms the above findings.
IMPRESSION: 1. No emergent large vessel occlusion.
2. Moderate narrowing of the proximal M 1 segment of the left middle
cerebral artery with attenuated appearance of the distal branches.
This is probably chronic, given the old left posterior MCA
distribution infarct.
3. No hemodynamically significant stenosis or other abnormality of
the carotid or vertebral arterial systems.
